# Patient Record
Sex: Female | Born: 1970
Health system: Southern US, Community
[De-identification: ages and names within clinical notes are randomized; demographics above are authoritative.]

## PROBLEM LIST (undated history)

## (undated) DIAGNOSIS — D649 Anemia, unspecified: Secondary | ICD-10-CM

## (undated) DIAGNOSIS — R011 Cardiac murmur, unspecified: Secondary | ICD-10-CM

## (undated) DIAGNOSIS — L309 Dermatitis, unspecified: Secondary | ICD-10-CM

## (undated) DIAGNOSIS — F32A Depression, unspecified: Secondary | ICD-10-CM

## (undated) DIAGNOSIS — T7840XA Allergy, unspecified, initial encounter: Secondary | ICD-10-CM

## (undated) HISTORY — DX: Cardiac murmur, unspecified: R01.1

## (undated) HISTORY — PX: WISDOM TOOTH EXTRACTION: SHX21

## (undated) HISTORY — PX: COLONOSCOPY: SHX174

## (undated) HISTORY — DX: Allergy, unspecified, initial encounter: T78.40XA

## (undated) HISTORY — DX: Anemia, unspecified: D64.9

---

## 1998-11-01 ENCOUNTER — Other Ambulatory Visit: Admission: RE | Admit: 1998-11-01 | Discharge: 1998-11-01 | Payer: Self-pay | Admitting: *Deleted

## 1999-07-11 ENCOUNTER — Other Ambulatory Visit: Admission: RE | Admit: 1999-07-11 | Discharge: 1999-07-11 | Payer: Self-pay | Admitting: *Deleted

## 1999-11-09 ENCOUNTER — Inpatient Hospital Stay (HOSPITAL_COMMUNITY): Admission: AD | Admit: 1999-11-09 | Discharge: 1999-11-09 | Payer: Self-pay | Admitting: *Deleted

## 1999-12-12 ENCOUNTER — Inpatient Hospital Stay (HOSPITAL_COMMUNITY): Admission: AD | Admit: 1999-12-12 | Discharge: 1999-12-12 | Payer: Self-pay | Admitting: *Deleted

## 2000-01-22 ENCOUNTER — Inpatient Hospital Stay (HOSPITAL_COMMUNITY): Admission: AD | Admit: 2000-01-22 | Discharge: 2000-01-24 | Payer: Self-pay | Admitting: Obstetrics and Gynecology

## 2000-01-27 ENCOUNTER — Encounter: Admission: RE | Admit: 2000-01-27 | Discharge: 2000-04-26 | Payer: Self-pay | Admitting: *Deleted

## 2000-02-29 ENCOUNTER — Other Ambulatory Visit: Admission: RE | Admit: 2000-02-29 | Discharge: 2000-02-29 | Payer: Self-pay | Admitting: *Deleted

## 2001-05-28 ENCOUNTER — Other Ambulatory Visit: Admission: RE | Admit: 2001-05-28 | Discharge: 2001-05-28 | Payer: Self-pay | Admitting: *Deleted

## 2002-06-02 ENCOUNTER — Other Ambulatory Visit: Admission: RE | Admit: 2002-06-02 | Discharge: 2002-06-02 | Payer: Self-pay | Admitting: *Deleted

## 2002-11-20 ENCOUNTER — Inpatient Hospital Stay (HOSPITAL_COMMUNITY): Admission: AD | Admit: 2002-11-20 | Discharge: 2002-11-20 | Payer: Self-pay | Admitting: Obstetrics and Gynecology

## 2002-12-11 ENCOUNTER — Inpatient Hospital Stay (HOSPITAL_COMMUNITY): Admission: AD | Admit: 2002-12-11 | Discharge: 2002-12-13 | Payer: Self-pay | Admitting: Obstetrics and Gynecology

## 2003-01-18 ENCOUNTER — Other Ambulatory Visit: Admission: RE | Admit: 2003-01-18 | Discharge: 2003-01-18 | Payer: Self-pay | Admitting: *Deleted

## 2004-01-27 ENCOUNTER — Other Ambulatory Visit: Admission: RE | Admit: 2004-01-27 | Discharge: 2004-01-27 | Payer: Self-pay | Admitting: *Deleted

## 2005-04-24 ENCOUNTER — Other Ambulatory Visit: Admission: RE | Admit: 2005-04-24 | Discharge: 2005-04-24 | Payer: Self-pay | Admitting: Obstetrics and Gynecology

## 2006-01-24 ENCOUNTER — Inpatient Hospital Stay (HOSPITAL_COMMUNITY): Admission: AD | Admit: 2006-01-24 | Discharge: 2006-01-26 | Payer: Self-pay | Admitting: Obstetrics and Gynecology

## 2008-08-25 ENCOUNTER — Emergency Department (HOSPITAL_COMMUNITY): Admission: EM | Admit: 2008-08-25 | Discharge: 2008-08-25 | Payer: Self-pay | Admitting: Emergency Medicine

## 2009-11-20 ENCOUNTER — Ambulatory Visit: Payer: Self-pay | Admitting: Internal Medicine

## 2009-12-28 ENCOUNTER — Ambulatory Visit: Payer: Self-pay | Admitting: Internal Medicine

## 2010-01-02 ENCOUNTER — Encounter: Admission: RE | Admit: 2010-01-02 | Discharge: 2010-01-02 | Payer: Self-pay | Admitting: Internal Medicine

## 2010-05-07 ENCOUNTER — Ambulatory Visit: Payer: Self-pay | Admitting: Internal Medicine

## 2011-06-28 LAB — POCT RAPID STREP A: Streptococcus, Group A Screen (Direct): NEGATIVE

## 2012-01-01 ENCOUNTER — Emergency Department (INDEPENDENT_AMBULATORY_CARE_PROVIDER_SITE_OTHER)
Admission: EM | Admit: 2012-01-01 | Discharge: 2012-01-01 | Disposition: A | Discharge status: Home / Self Care | Payer: BC Managed Care – PPO | Source: Home / Self Care | Attending: Family Medicine | Admitting: Family Medicine

## 2012-01-01 ENCOUNTER — Encounter (HOSPITAL_COMMUNITY): Payer: Self-pay | Admitting: Cardiology

## 2012-01-01 DIAGNOSIS — J329 Chronic sinusitis, unspecified: Secondary | ICD-10-CM

## 2012-01-01 DIAGNOSIS — J40 Bronchitis, not specified as acute or chronic: Secondary | ICD-10-CM

## 2012-01-01 HISTORY — DX: Dermatitis, unspecified: L30.9

## 2012-01-01 LAB — POCT RAPID STREP A: Streptococcus, Group A Screen (Direct): NEGATIVE

## 2012-01-01 MED ORDER — TRIAMCINOLONE ACETONIDE(NASAL) 55 MCG/ACT NA INHA: 2.0000 | Freq: Every day | NASAL | Status: DC

## 2012-01-01 MED ORDER — ACETAMINOPHEN 325 MG PO TABS
ORAL_TABLET | ORAL | Status: AC
  Filled 2012-01-01: qty 2

## 2012-01-01 MED ORDER — FEXOFENADINE HCL 60 MG PO TABS: 60.0000 mg | ORAL_TABLET | Freq: Two times a day (BID) | ORAL | Status: DC

## 2012-01-01 MED ORDER — AZITHROMYCIN 250 MG PO TABS: 250.0000 mg | ORAL_TABLET | Freq: Every day | ORAL | Status: AC

## 2012-01-01 MED ORDER — FLUTICASONE-SALMETEROL 100-50 MCG/DOSE IN AEPB: 1.0000 | INHALATION_SPRAY | Freq: Two times a day (BID) | RESPIRATORY_TRACT | Status: DC

## 2012-01-01 MED ORDER — ALBUTEROL SULFATE HFA 108 (90 BASE) MCG/ACT IN AERS: 2.0000 | INHALATION_SPRAY | Freq: Four times a day (QID) | RESPIRATORY_TRACT | Status: DC | PRN

## 2012-01-01 MED ORDER — HYDROCODONE-ACETAMINOPHEN 7.5-500 MG/15ML PO SOLN: 15.0000 mL | Freq: Three times a day (TID) | ORAL | Status: AC | PRN

## 2012-01-01 MED ORDER — IBUPROFEN 800 MG PO TABS
ORAL_TABLET | ORAL | Status: AC
  Filled 2012-01-01: qty 1

## 2012-01-01 MED ORDER — IBUPROFEN 600 MG PO TABS: 600.0000 mg | ORAL_TABLET | Freq: Three times a day (TID) | ORAL | Status: AC

## 2012-01-01 MED ORDER — ONDANSETRON 4 MG PO TBDP
4.0000 mg | ORAL_TABLET | Freq: Once | ORAL | Status: AC
  Administered 2012-01-01: 4 mg via ORAL

## 2012-01-01 MED ORDER — ACETAMINOPHEN 325 MG PO TABS
650.0000 mg | ORAL_TABLET | Freq: Once | ORAL | Status: AC
  Administered 2012-01-01: 650 mg via ORAL

## 2012-01-01 MED ORDER — IBUPROFEN 800 MG PO TABS: 800.0000 mg | ORAL_TABLET | Freq: Once | ORAL | Status: DC

## 2012-01-01 MED ORDER — ONDANSETRON 4 MG PO TBDP
ORAL_TABLET | ORAL | Status: AC
  Filled 2012-01-01: qty 1

## 2012-01-01 MED ORDER — PREDNISONE 20 MG PO TABS: ORAL_TABLET | ORAL | Status: AC

## 2012-01-01 NOTE — ED Provider Notes (Signed)
History     CSN: [id number]  Arrival date & time 01/01/12  1433   First MD Initiated Contact with Patient 01/01/12 1609      Chief Complaint  Patient presents with  . Shortness of Breath  . Cough  . Sore Throat    (Consider location/radiation/quality/duration/timing/severity/associated sxs/prior treatment) HPI Comments: 41 y/o female h/o asthma, allergic/chronic rhinitis and recurrent sinusitis. Here c/o 2 days with fever up to 103, generalized body aches, cough with green sputum, sore throat, nasal congestion, sinus pressure, headache, nausea. No vomiting or diarrhea. Using her albuterol and Advair inhalers daily. Denies current wheezing or shortness of breath. Needs refills on her inhalers. No chest pain. Had allergy symptoms about 3 days prior worsening cough,malaise and fever.   Past Medical History  Diagnosis Date  . Asthma   . Dermatitis     Past Surgical History  Procedure Date  . Vaginal delivery 2001, 2004, 2007    Family History  Problem Relation Age of Onset  . Hyperlipidemia Mother   . Thyroid disease Mother   . Hyperlipidemia Father   . Bipolar disorder Father   . Neuropathy Father     History  Substance Use Topics  . Smoking status: Never Smoker   . Smokeless tobacco: Not on file  . Alcohol Use: Yes     occas    OB History    Grav Para Term Preterm Abortions TAB SAB Ect Mult Living                  Review of Systems  Constitutional: Positive for fever and chills. Negative for appetite change.  HENT: Positive for congestion, sore throat, rhinorrhea, postnasal drip and sinus pressure. Negative for ear pain, trouble swallowing, neck pain and neck stiffness.   Respiratory: Positive for cough and chest tightness. Negative for shortness of breath and wheezing.   Cardiovascular: Negative for chest pain and leg swelling.  Gastrointestinal: Positive for nausea. Negative for vomiting, abdominal pain and diarrhea.  Musculoskeletal: Positive for myalgias  and arthralgias.  Skin: Negative for rash.  Neurological: Positive for headaches. Negative for dizziness.    Allergies  Seasonal  Home Medications   Current Outpatient Rx  Name Route Sig Dispense Refill  . ALBUTEROL SULFATE HFA 108 (90 BASE) MCG/ACT IN AERS Inhalation Inhale 2 puffs into the lungs every 6 (six) hours as needed. 1 Inhaler 0  . AZITHROMYCIN 250 MG PO TABS Oral Take 1 tablet (250 mg total) by mouth daily. Take first 2 tablets together, then 1 every day until finished. 6 tablet 0  . FEXOFENADINE HCL 60 MG PO TABS Oral Take 1 tablet (60 mg total) by mouth 2 (two) times daily. 60 tablet 0  . FLUTICASONE-SALMETEROL 100-50 MCG/DOSE IN AEPB Inhalation Inhale 1 puff into the lungs 2 (two) times daily. Uses at on set of congestion 60 each 0  . HYDROCODONE-ACETAMINOPHEN 7.5-500 MG/15ML PO SOLN Oral Take 15 mLs by mouth every 8 (eight) hours as needed for pain or cough. 120 mL 0  . IBUPROFEN 600 MG PO TABS Oral Take 1 tablet (600 mg total) by mouth 3 (three) times daily. 20 tablet 0  . PREDNISONE 20 MG PO TABS  2 tabs po daily for 5 days 10 tablet no  . TRIAMCINOLONE ACETONIDE 55 MCG/ACT NA INHA Nasal Place 2 sprays into the nose daily. 1 Inhaler 0    BP 140/85  Pulse 125  Temp(Src) 101.4 F (38.6 C) (Oral)  Resp 21  SpO2 100%  LMP  11/03/2011  Physical Exam  Nursing note and vitals reviewed. Constitutional: She is oriented to person, place, and time. She appears well-developed and well-nourished.       Febrile, laying in bed.  HENT:  Head: Normocephalic and atraumatic.  Right Ear: External ear normal.  Left Ear: External ear normal.       Nasal Congestion with erythema and swelling of nasal turbinates, white rhinorrhea. Pharyngeal erythema no swelling or exudates. No uvula deviation. No trismus. Reported frontomaxillary sinus tenderness with palpation. TM's with increased vascular markings and some dullness bilaterally no swelling or bulging   Eyes: Conjunctivae are  normal. Pupils are equal, round, and reactive to light. Right eye exhibits no discharge. Left eye exhibits no discharge. No scleral icterus.  Neck: Neck supple. No thyromegaly present.  Cardiovascular: Regular rhythm, normal heart sounds and intact distal pulses.  Exam reveals no gallop and no friction rub.   No murmur heard.      Tachycardic due to fever.  Pulmonary/Chest: Effort normal. No respiratory distress. She has no wheezes. She has no rales. She exhibits no tenderness.       Bronchitic productive cough. green thick sputum observed.   Abdominal: Soft. She exhibits no distension. There is no tenderness.  Lymphadenopathy:    She has no cervical adenopathy.  Neurological: She is alert and oriented to person, place, and time.  Skin: No rash noted.    ED Course  Procedures (including critical care time)   Labs Reviewed  POCT RAPID STREP A ([facility]RG CARE ONLY)  LAB REPORT - SCANNED   No results found.   1. Bronchitis   2. Sinusitis       MDM  Negative rapid strept test. Treated with prednisone, azithromycin, ibuprofen, allegra and hydrocodone. Refilled albuterol and advair. Asked to returned if chest pain, difficulty breathing or worsening symptoms despite following treatment.        Sharin Grave, MD 01/03/12 [phone]

## 2012-01-01 NOTE — ED Notes (Signed)
Pt reports onset of symptoms cough, sore throat, chills and allergy symptoms that started 2 days ago. Pt reports fever of 103 at home. History of asthma and has been taking albuterol at hs and advair daily for the past 2 days. Pt almost out of advair.

## 2012-01-01 NOTE — Discharge Instructions (Signed)
  Is very important top keep well hydrated. Take the prescribed medications as instructed. Take ibuprofen scheduled every 8 hours for the next 24-48 hours take with food and plenty of liquids as it can upset your stomach. Use nasal saline spray at least 3 times a day. (simply saline is over the counter) can alternate with your Nasacort. Return if difficulty breathing or not keeping fluids down.

## 2012-10-06 ENCOUNTER — Other Ambulatory Visit: Payer: BC Managed Care – PPO | Admitting: Internal Medicine

## 2012-10-06 ENCOUNTER — Other Ambulatory Visit: Payer: Self-pay | Admitting: Internal Medicine

## 2012-10-06 DIAGNOSIS — Z0000 Encounter for general adult medical examination without abnormal findings: Secondary | ICD-10-CM

## 2012-10-06 DIAGNOSIS — J45909 Unspecified asthma, uncomplicated: Secondary | ICD-10-CM

## 2012-10-06 LAB — LIPID PANEL
Cholesterol: 168 mg/dL (ref 0–200)
HDL: 66 mg/dL (ref >39)
LDL Cholesterol: 82 mg/dL (ref 0–99)
Total CHOL/HDL Ratio: 2.5 Ratio
Triglycerides: 99 mg/dL (ref <150)
VLDL: 20 mg/dL (ref 0–40)

## 2012-10-06 LAB — COMPREHENSIVE METABOLIC PANEL
ALT: 12 U/L (ref 0–35)
AST: 16 U/L (ref 0–37)
Albumin: 3.7 g/dL (ref 3.5–5.2)
Alkaline Phosphatase: 55 U/L (ref 39–117)
BUN: 14 mg/dL (ref 6–23)
CO2: 26 mEq/L (ref 19–32)
Calcium: 9.2 mg/dL (ref 8.4–10.5)
Chloride: 105 mEq/L (ref 96–112)
Creat: 0.77 mg/dL (ref 0.50–1.10)
Glucose, Bld: 78 mg/dL (ref 70–99)
Potassium: 4 mEq/L (ref 3.5–5.3)
Sodium: 138 mEq/L (ref 135–145)
Total Bilirubin: 1 mg/dL (ref 0.3–1.2)
Total Protein: 6.6 g/dL (ref 6.0–8.3)

## 2012-10-06 LAB — CBC WITH DIFFERENTIAL/PLATELET
Basophils Absolute: 0 10*3/uL (ref 0.0–0.1)
Basophils Relative: 0 % (ref 0–1)
Eosinophils Absolute: 0.5 10*3/uL (ref 0.0–0.7)
Eosinophils Relative: 4 % (ref 0–5)
HCT: 39.3 % (ref 36.0–46.0)
Hemoglobin: 13.5 g/dL (ref 12.0–15.0)
Lymphocytes Relative: 20 % (ref 12–46)
Lymphs Abs: 2.6 10*3/uL (ref 0.7–4.0)
MCH: 32.9 pg (ref 26.0–34.0)
MCHC: 34.4 g/dL (ref 30.0–36.0)
MCV: 95.9 fL (ref 78.0–100.0)
Monocytes Absolute: 0.6 10*3/uL (ref 0.1–1.0)
Monocytes Relative: 5 % (ref 3–12)
Neutro Abs: 9.1 10*3/uL — ABNORMAL HIGH (ref 1.7–7.7)
Neutrophils Relative %: 71 % (ref 43–77)
Platelets: 346 10*3/uL (ref 150–400)
RBC: 4.1 MIL/uL (ref 3.87–5.11)
RDW: 13.4 % (ref 11.5–15.5)
WBC: 12.8 10*3/uL — ABNORMAL HIGH (ref 4.0–10.5)

## 2012-10-06 LAB — TSH: TSH: 3.779 u[IU]/mL (ref 0.350–4.500)

## 2012-10-07 LAB — VITAMIN D 25 HYDROXY (VIT D DEFICIENCY, FRACTURES): Vit D, 25-Hydroxy: 67 ng/mL (ref 30–89)

## 2012-10-08 ENCOUNTER — Other Ambulatory Visit: Payer: BC Managed Care – PPO | Admitting: Internal Medicine

## 2012-10-08 ENCOUNTER — Ambulatory Visit (INDEPENDENT_AMBULATORY_CARE_PROVIDER_SITE_OTHER): Payer: BC Managed Care – PPO | Admitting: Internal Medicine

## 2012-10-08 ENCOUNTER — Encounter: Payer: Self-pay | Admitting: Internal Medicine

## 2012-10-08 VITALS — BP 104/72 | HR 76 | Temp 98.5°F | Ht 62.5 in | Wt 118.0 lb

## 2012-10-08 DIAGNOSIS — M8580 Other specified disorders of bone density and structure, unspecified site: Secondary | ICD-10-CM | POA: Insufficient documentation

## 2012-10-08 DIAGNOSIS — I8390 Asymptomatic varicose veins of unspecified lower extremity: Secondary | ICD-10-CM

## 2012-10-08 DIAGNOSIS — J309 Allergic rhinitis, unspecified: Secondary | ICD-10-CM

## 2012-10-08 DIAGNOSIS — M899 Disorder of bone, unspecified: Secondary | ICD-10-CM

## 2012-10-08 DIAGNOSIS — J069 Acute upper respiratory infection, unspecified: Secondary | ICD-10-CM

## 2012-10-08 DIAGNOSIS — I83893 Varicose veins of bilateral lower extremities with other complications: Secondary | ICD-10-CM | POA: Insufficient documentation

## 2012-10-08 DIAGNOSIS — M949 Disorder of cartilage, unspecified: Secondary | ICD-10-CM

## 2012-10-08 DIAGNOSIS — Z0000 Encounter for general adult medical examination without abnormal findings: Secondary | ICD-10-CM

## 2012-10-08 DIAGNOSIS — J45909 Unspecified asthma, uncomplicated: Secondary | ICD-10-CM

## 2012-10-08 NOTE — Patient Instructions (Addendum)
Call endocrinologist you saw in 2011 and ask for follow up recommendations for osteopenia. Hearing screen is normal. Antigliadin antibodies added at your request for Family history of sprue.

## 2012-10-09 ENCOUNTER — Encounter: Payer: BC Managed Care – PPO | Admitting: Internal Medicine

## 2012-10-09 LAB — CELIAC PANEL 10
Endomysial Screen: NEGATIVE
Gliadin IgA: 4.1 U/mL (ref <20)
Gliadin IgG: 14.1 U/mL (ref <20)
IgA: 221 mg/dL (ref 69–380)
Tissue Transglut Ab: 7.1 U/mL (ref <20)
Tissue Transglutaminase Ab, IgA: 2.8 U/mL (ref <20)

## 2012-11-28 NOTE — Progress Notes (Signed)
Subjective:    Patient ID: Tammy Hendrix, female    DOB: 11-10-1970, 42 y.o.   MRN: [id number]  HPI 42 year old white female presents for health maintenance and evaluation of medical problems. Patient wants her hearing checked. Complains of multiple varicosities both legs. She's had a recent URI. Had sinus infection in December treated with a steroid injection. Dr. Vincente Poli is GYN physician. Patient has history of familial osteoporosis. History of allergic rhinitis treated by [facility]. Uses Claritin daily, when necessary albuterol, when necessary Advair and Nasonex.  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 Husband is an attorney with [employer]. She has 3 children 2 daughters and a son. Social alcohol consumption. Does not smoke.  Family history: Father is a Development worker, community. He has a history of hyperlipidemia, neuropathy, genetic osteoporosis, esophagitis and gastritis. Mother has history of gallstones and allergic rhinitis and asthma. She also has hypertension. 2 brothers in good health. 2 sisters in good health.  No known drug allergies. No history of operations. No hospitalizations other than childbirth.  Also has  Eczema and asthma. Takes vitamin D and calcium.  Patient had bone density study in April 2011 with T score LS-spine -2.2 compared to 2005 when it was -2.5. Left femoral T score -2.1 and was previously -2.6. This study shows osteopenia rather than osteoporosis and looks better than the study in 2005.  Allergy testing in 2010 positive for dust mite, trees, grasses, shellfish , peanut, dog, cat and feathers    Review of Systems  Constitutional: Negative.   HENT: Positive for hearing loss, congestion, rhinorrhea, sneezing and postnasal drip.   Eyes: Negative.   Respiratory:       History of asthma  Endocrine:       History of familial osteoporosis/osteopenia  Genitourinary: Negative.   Allergic/Immunologic: Positive for environmental allergies and food  allergies.  Neurological: Negative.   Hematological: Negative.   Psychiatric/Behavioral: Negative.        Objective:   Physical Exam  Vitals reviewed. Constitutional: She is oriented to person, place, and time. She appears well-developed and well-nourished. No distress.  HENT:  Head: Normocephalic and atraumatic.  Right Ear: External ear normal.  Left Ear: External ear normal.  Nose: Nose normal.  Mouth/Throat: Oropharynx is clear and moist. No oropharyngeal exudate.  Eyes: Conjunctivae and EOM are normal. Pupils are equal, round, and reactive to light. Right eye exhibits no discharge. Left eye exhibits no discharge. No scleral icterus.  . Is normal in each year at 20 dB  Neck: Normal range of motion. Neck supple. No JVD present. No tracheal deviation present. No thyromegaly present.  Cardiovascular: Normal rate, regular rhythm, normal heart sounds and intact distal pulses.   No murmur heard. Pulmonary/Chest: Effort normal and breath sounds normal. No respiratory distress. She has no wheezes. She has no rales. She exhibits no tenderness.  Breasts normal female  Abdominal: Soft. Bowel sounds are normal. She exhibits no distension and no mass. There is no tenderness. There is no rebound and no guarding.  Musculoskeletal: She exhibits no edema.  Lymphadenopathy:    She has no cervical adenopathy.  Neurological: She is alert and oriented to person, place, and time. She has normal reflexes. She displays normal reflexes. No cranial nerve deficit. Coordination normal.  Skin: Skin is warm and dry. No rash noted. She is not diaphoretic.  Multiple varicosities both legs  Psychiatric: She has a normal mood and affect. Her behavior is normal. Judgment and thought content normal.  Assessment & Plan:  Multiple varicosities both legs  Familial osteopenia/osteoporosis-advise patient to contact endocrinologist that she saw in 2011 for device. Seems that bone density improved from  2005-2011.  Allergic rhinitis  History of asthma  History of eczema  Plan: Hearing is within normal limits although she is complaining of some hearing loss. Will schedule consultation with vascular surgeon regarding varicosities of legs. Patient asking for test for celiac disease. This will be added to her lab work.

## 2012-12-01 ENCOUNTER — Telehealth: Payer: Self-pay

## 2012-12-01 DIAGNOSIS — I83893 Varicose veins of bilateral lower extremities with other complications: Secondary | ICD-10-CM

## 2012-12-02 NOTE — Telephone Encounter (Signed)
Referral done to [facility]. They will call patient directly

## 2013-01-07 ENCOUNTER — Telehealth: Payer: Self-pay | Admitting: Internal Medicine

## 2014-08-17 ENCOUNTER — Encounter: Payer: Self-pay | Admitting: Internal Medicine

## 2014-12-27 ENCOUNTER — Telehealth: Payer: Self-pay | Admitting: Internal Medicine

## 2014-12-27 ENCOUNTER — Ambulatory Visit (INDEPENDENT_AMBULATORY_CARE_PROVIDER_SITE_OTHER): Payer: BLUE CROSS/BLUE SHIELD | Admitting: Internal Medicine

## 2014-12-27 ENCOUNTER — Encounter: Payer: Self-pay | Admitting: Internal Medicine

## 2014-12-27 VITALS — BP 106/74 | HR 70 | Temp 98.0°F | Wt 120.0 lb

## 2014-12-27 DIAGNOSIS — A09 Infectious gastroenteritis and colitis, unspecified: Secondary | ICD-10-CM | POA: Diagnosis not present

## 2014-12-27 MED ORDER — CIPROFLOXACIN HCL 500 MG PO TABS: 500.0000 mg | ORAL_TABLET | Freq: Two times a day (BID) | ORAL | Status: DC

## 2014-12-27 MED ORDER — METRONIDAZOLE 500 MG PO TABS: 500.0000 mg | ORAL_TABLET | Freq: Two times a day (BID) | ORAL | Status: DC

## 2014-12-27 NOTE — Progress Notes (Signed)
   Subjective:    Patient ID: Tammy Hendrix, female    DOB: 06-Sep-1971, 44 y.o.   MRN: [id number]  HPI  She went on a trip to [place] and [place] She returned home on Sunday, April 3. Was beginning to have a common on cramping prior to departure. After arriving home she developed diarrhea. No blood in stool. No fever or shaking chills. While there, she ate a hamburger that was not cooked all that well and a runny egg. No significant nausea just abdominal cramping and diarrhea. No vomiting    Review of Systems     Objective:   Physical Exam  Not examined      Assessment & Plan:  Diarrhea with international travel  Plan: Cipro 500 mg twice daily for 7 days. Flagyl 500 mg twice daily for 7 days. Clear liquids and advance diet slowly.

## 2014-12-27 NOTE — Telephone Encounter (Signed)
Spoke with patient and advised that Dr. Renold Genta would really prefer that she come in to see her.  Azythromycin most likely will NOT take care of this.   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  IF she is unable to get someone to take it today, she will reschedule for appointment for tomorrow, 4/6 at 11:00.  Otherwise, she'll be seen today, 4/5 @ 4:15 p.m.  Confirmed.

## 2014-12-27 NOTE — Patient Instructions (Signed)
Take Cipro and Flagyl as prescribed. Clear liquids and advance diet slowly. Call if not better in 72 hours or sooner if worse

## 2014-12-27 NOTE — Telephone Encounter (Signed)
Was traveling in the [place] returned on Sunday.  Started having cramping and diarrhea all day yesterday and some today.  Taking Azythromycin (day #2)   Offered her appointment this evening at 4:15.  Advised we would not be seeing patients tomorrow and next appointment would be on Thursday.  Advised patient to do clear liquids until diarrhea resolved.  Said she had toast this a.m. And she said "I'm hungry".  I said, you need to allow the gut to rest by only having clear liquids until the diarrhea resolves.  No vomiting.  Cramps seems to be the main issue for her at this time causing her pain.  Cramping described as menstrual cramps.  Rated as a 5-6 on a scale of 1-10.

## 2014-12-27 NOTE — Telephone Encounter (Signed)
Please have her come in likely has Traveler's diarrhea

## 2015-12-26 DIAGNOSIS — J3089 Other allergic rhinitis: Secondary | ICD-10-CM | POA: Diagnosis not present

## 2015-12-26 DIAGNOSIS — J301 Allergic rhinitis due to pollen: Secondary | ICD-10-CM | POA: Diagnosis not present

## 2015-12-29 DIAGNOSIS — J301 Allergic rhinitis due to pollen: Secondary | ICD-10-CM | POA: Diagnosis not present

## 2015-12-29 DIAGNOSIS — J3089 Other allergic rhinitis: Secondary | ICD-10-CM | POA: Diagnosis not present

## 2016-01-18 DIAGNOSIS — J4520 Mild intermittent asthma, uncomplicated: Secondary | ICD-10-CM | POA: Diagnosis not present

## 2016-01-18 DIAGNOSIS — J3089 Other allergic rhinitis: Secondary | ICD-10-CM | POA: Diagnosis not present

## 2016-01-18 DIAGNOSIS — J301 Allergic rhinitis due to pollen: Secondary | ICD-10-CM | POA: Diagnosis not present

## 2016-01-26 DIAGNOSIS — J3089 Other allergic rhinitis: Secondary | ICD-10-CM | POA: Diagnosis not present

## 2016-01-26 DIAGNOSIS — J301 Allergic rhinitis due to pollen: Secondary | ICD-10-CM | POA: Diagnosis not present

## 2016-02-02 DIAGNOSIS — J3089 Other allergic rhinitis: Secondary | ICD-10-CM | POA: Diagnosis not present

## 2016-02-02 DIAGNOSIS — J301 Allergic rhinitis due to pollen: Secondary | ICD-10-CM | POA: Diagnosis not present

## 2016-02-09 DIAGNOSIS — J301 Allergic rhinitis due to pollen: Secondary | ICD-10-CM | POA: Diagnosis not present

## 2016-02-09 DIAGNOSIS — J3089 Other allergic rhinitis: Secondary | ICD-10-CM | POA: Diagnosis not present

## 2016-02-16 DIAGNOSIS — J3089 Other allergic rhinitis: Secondary | ICD-10-CM | POA: Diagnosis not present

## 2016-02-16 DIAGNOSIS — J301 Allergic rhinitis due to pollen: Secondary | ICD-10-CM | POA: Diagnosis not present

## 2016-02-23 DIAGNOSIS — J3089 Other allergic rhinitis: Secondary | ICD-10-CM | POA: Diagnosis not present

## 2016-02-23 DIAGNOSIS — J301 Allergic rhinitis due to pollen: Secondary | ICD-10-CM | POA: Diagnosis not present

## 2016-02-27 ENCOUNTER — Ambulatory Visit: Payer: BLUE CROSS/BLUE SHIELD | Admitting: Pediatrics

## 2016-03-06 DIAGNOSIS — J301 Allergic rhinitis due to pollen: Secondary | ICD-10-CM | POA: Diagnosis not present

## 2016-03-06 DIAGNOSIS — J3089 Other allergic rhinitis: Secondary | ICD-10-CM | POA: Diagnosis not present

## 2016-03-21 DIAGNOSIS — Z6822 Body mass index (BMI) 22.0-22.9, adult: Secondary | ICD-10-CM | POA: Diagnosis not present

## 2016-03-21 DIAGNOSIS — Z1231 Encounter for screening mammogram for malignant neoplasm of breast: Secondary | ICD-10-CM | POA: Diagnosis not present

## 2016-03-21 DIAGNOSIS — Z1212 Encounter for screening for malignant neoplasm of rectum: Secondary | ICD-10-CM | POA: Diagnosis not present

## 2016-03-21 DIAGNOSIS — Z01419 Encounter for gynecological examination (general) (routine) without abnormal findings: Secondary | ICD-10-CM | POA: Diagnosis not present

## 2016-03-22 DIAGNOSIS — J301 Allergic rhinitis due to pollen: Secondary | ICD-10-CM | POA: Diagnosis not present

## 2016-03-22 DIAGNOSIS — J3089 Other allergic rhinitis: Secondary | ICD-10-CM | POA: Diagnosis not present

## 2016-03-25 DIAGNOSIS — L820 Inflamed seborrheic keratosis: Secondary | ICD-10-CM | POA: Diagnosis not present

## 2016-03-25 DIAGNOSIS — L821 Other seborrheic keratosis: Secondary | ICD-10-CM | POA: Diagnosis not present

## 2016-03-25 DIAGNOSIS — L720 Epidermal cyst: Secondary | ICD-10-CM | POA: Diagnosis not present

## 2016-03-25 DIAGNOSIS — B078 Other viral warts: Secondary | ICD-10-CM | POA: Diagnosis not present

## 2016-03-29 DIAGNOSIS — J301 Allergic rhinitis due to pollen: Secondary | ICD-10-CM | POA: Diagnosis not present

## 2016-03-29 DIAGNOSIS — J3089 Other allergic rhinitis: Secondary | ICD-10-CM | POA: Diagnosis not present

## 2016-04-04 DIAGNOSIS — J3089 Other allergic rhinitis: Secondary | ICD-10-CM | POA: Diagnosis not present

## 2016-04-04 DIAGNOSIS — J301 Allergic rhinitis due to pollen: Secondary | ICD-10-CM | POA: Diagnosis not present

## 2016-04-19 DIAGNOSIS — J301 Allergic rhinitis due to pollen: Secondary | ICD-10-CM | POA: Diagnosis not present

## 2016-04-19 DIAGNOSIS — J3089 Other allergic rhinitis: Secondary | ICD-10-CM | POA: Diagnosis not present

## 2016-05-02 DIAGNOSIS — J3089 Other allergic rhinitis: Secondary | ICD-10-CM | POA: Diagnosis not present

## 2016-05-02 DIAGNOSIS — J301 Allergic rhinitis due to pollen: Secondary | ICD-10-CM | POA: Diagnosis not present

## 2016-05-20 DIAGNOSIS — J3089 Other allergic rhinitis: Secondary | ICD-10-CM | POA: Diagnosis not present

## 2016-05-20 DIAGNOSIS — J301 Allergic rhinitis due to pollen: Secondary | ICD-10-CM | POA: Diagnosis not present

## 2016-05-24 DIAGNOSIS — J3089 Other allergic rhinitis: Secondary | ICD-10-CM | POA: Diagnosis not present

## 2016-05-24 DIAGNOSIS — J301 Allergic rhinitis due to pollen: Secondary | ICD-10-CM | POA: Diagnosis not present

## 2016-05-31 DIAGNOSIS — J3089 Other allergic rhinitis: Secondary | ICD-10-CM | POA: Diagnosis not present

## 2016-05-31 DIAGNOSIS — J301 Allergic rhinitis due to pollen: Secondary | ICD-10-CM | POA: Diagnosis not present

## 2016-06-05 DIAGNOSIS — L821 Other seborrheic keratosis: Secondary | ICD-10-CM | POA: Diagnosis not present

## 2016-06-05 DIAGNOSIS — B078 Other viral warts: Secondary | ICD-10-CM | POA: Diagnosis not present

## 2016-06-14 DIAGNOSIS — J301 Allergic rhinitis due to pollen: Secondary | ICD-10-CM | POA: Diagnosis not present

## 2016-06-14 DIAGNOSIS — J3089 Other allergic rhinitis: Secondary | ICD-10-CM | POA: Diagnosis not present

## 2016-06-28 DIAGNOSIS — J301 Allergic rhinitis due to pollen: Secondary | ICD-10-CM | POA: Diagnosis not present

## 2016-06-28 DIAGNOSIS — J3089 Other allergic rhinitis: Secondary | ICD-10-CM | POA: Diagnosis not present

## 2016-07-02 DIAGNOSIS — J3089 Other allergic rhinitis: Secondary | ICD-10-CM | POA: Diagnosis not present

## 2016-07-12 DIAGNOSIS — J301 Allergic rhinitis due to pollen: Secondary | ICD-10-CM | POA: Diagnosis not present

## 2016-07-12 DIAGNOSIS — J3089 Other allergic rhinitis: Secondary | ICD-10-CM | POA: Diagnosis not present

## 2016-08-02 DIAGNOSIS — J3089 Other allergic rhinitis: Secondary | ICD-10-CM | POA: Diagnosis not present

## 2016-08-02 DIAGNOSIS — J301 Allergic rhinitis due to pollen: Secondary | ICD-10-CM | POA: Diagnosis not present

## 2016-08-09 DIAGNOSIS — J301 Allergic rhinitis due to pollen: Secondary | ICD-10-CM | POA: Diagnosis not present

## 2016-08-09 DIAGNOSIS — J3089 Other allergic rhinitis: Secondary | ICD-10-CM | POA: Diagnosis not present

## 2016-08-13 DIAGNOSIS — J3089 Other allergic rhinitis: Secondary | ICD-10-CM | POA: Diagnosis not present

## 2016-08-13 DIAGNOSIS — Z23 Encounter for immunization: Secondary | ICD-10-CM | POA: Diagnosis not present

## 2016-08-13 DIAGNOSIS — J301 Allergic rhinitis due to pollen: Secondary | ICD-10-CM | POA: Diagnosis not present

## 2016-09-05 DIAGNOSIS — J3089 Other allergic rhinitis: Secondary | ICD-10-CM | POA: Diagnosis not present

## 2016-09-05 DIAGNOSIS — J301 Allergic rhinitis due to pollen: Secondary | ICD-10-CM | POA: Diagnosis not present

## 2016-09-18 DIAGNOSIS — J3089 Other allergic rhinitis: Secondary | ICD-10-CM | POA: Diagnosis not present

## 2016-09-18 DIAGNOSIS — J301 Allergic rhinitis due to pollen: Secondary | ICD-10-CM | POA: Diagnosis not present

## 2016-10-18 DIAGNOSIS — J301 Allergic rhinitis due to pollen: Secondary | ICD-10-CM | POA: Diagnosis not present

## 2016-10-18 DIAGNOSIS — J3089 Other allergic rhinitis: Secondary | ICD-10-CM | POA: Diagnosis not present

## 2016-10-25 DIAGNOSIS — J3089 Other allergic rhinitis: Secondary | ICD-10-CM | POA: Diagnosis not present

## 2016-10-25 DIAGNOSIS — J301 Allergic rhinitis due to pollen: Secondary | ICD-10-CM | POA: Diagnosis not present

## 2016-11-22 DIAGNOSIS — J3089 Other allergic rhinitis: Secondary | ICD-10-CM | POA: Diagnosis not present

## 2016-11-22 DIAGNOSIS — J301 Allergic rhinitis due to pollen: Secondary | ICD-10-CM | POA: Diagnosis not present

## 2016-11-29 DIAGNOSIS — J301 Allergic rhinitis due to pollen: Secondary | ICD-10-CM | POA: Diagnosis not present

## 2016-11-29 DIAGNOSIS — J3089 Other allergic rhinitis: Secondary | ICD-10-CM | POA: Diagnosis not present

## 2016-12-17 DIAGNOSIS — J3089 Other allergic rhinitis: Secondary | ICD-10-CM | POA: Diagnosis not present

## 2016-12-17 DIAGNOSIS — J301 Allergic rhinitis due to pollen: Secondary | ICD-10-CM | POA: Diagnosis not present

## 2017-01-08 DIAGNOSIS — J3089 Other allergic rhinitis: Secondary | ICD-10-CM | POA: Diagnosis not present

## 2017-01-08 DIAGNOSIS — J301 Allergic rhinitis due to pollen: Secondary | ICD-10-CM | POA: Diagnosis not present

## 2017-01-15 DIAGNOSIS — J3089 Other allergic rhinitis: Secondary | ICD-10-CM | POA: Diagnosis not present

## 2017-01-15 DIAGNOSIS — J301 Allergic rhinitis due to pollen: Secondary | ICD-10-CM | POA: Diagnosis not present

## 2017-01-27 DIAGNOSIS — J3089 Other allergic rhinitis: Secondary | ICD-10-CM | POA: Diagnosis not present

## 2017-01-27 DIAGNOSIS — J301 Allergic rhinitis due to pollen: Secondary | ICD-10-CM | POA: Diagnosis not present

## 2017-02-12 DIAGNOSIS — J3089 Other allergic rhinitis: Secondary | ICD-10-CM | POA: Diagnosis not present

## 2017-02-12 DIAGNOSIS — J301 Allergic rhinitis due to pollen: Secondary | ICD-10-CM | POA: Diagnosis not present

## 2017-02-19 DIAGNOSIS — J3089 Other allergic rhinitis: Secondary | ICD-10-CM | POA: Diagnosis not present

## 2017-02-19 DIAGNOSIS — J301 Allergic rhinitis due to pollen: Secondary | ICD-10-CM | POA: Diagnosis not present

## 2017-02-25 DIAGNOSIS — J301 Allergic rhinitis due to pollen: Secondary | ICD-10-CM | POA: Diagnosis not present

## 2017-02-25 DIAGNOSIS — J3089 Other allergic rhinitis: Secondary | ICD-10-CM | POA: Diagnosis not present

## 2017-03-03 DIAGNOSIS — J301 Allergic rhinitis due to pollen: Secondary | ICD-10-CM | POA: Diagnosis not present

## 2017-03-03 DIAGNOSIS — J3089 Other allergic rhinitis: Secondary | ICD-10-CM | POA: Diagnosis not present

## 2017-03-13 DIAGNOSIS — J301 Allergic rhinitis due to pollen: Secondary | ICD-10-CM | POA: Diagnosis not present

## 2017-03-13 DIAGNOSIS — J3089 Other allergic rhinitis: Secondary | ICD-10-CM | POA: Diagnosis not present

## 2017-03-19 DIAGNOSIS — J3089 Other allergic rhinitis: Secondary | ICD-10-CM | POA: Diagnosis not present

## 2017-03-19 DIAGNOSIS — J4520 Mild intermittent asthma, uncomplicated: Secondary | ICD-10-CM | POA: Diagnosis not present

## 2017-03-19 DIAGNOSIS — L209 Atopic dermatitis, unspecified: Secondary | ICD-10-CM | POA: Diagnosis not present

## 2017-03-19 DIAGNOSIS — J301 Allergic rhinitis due to pollen: Secondary | ICD-10-CM | POA: Diagnosis not present

## 2017-03-20 DIAGNOSIS — J301 Allergic rhinitis due to pollen: Secondary | ICD-10-CM | POA: Diagnosis not present

## 2017-03-25 DIAGNOSIS — Z1231 Encounter for screening mammogram for malignant neoplasm of breast: Secondary | ICD-10-CM | POA: Diagnosis not present

## 2017-03-25 DIAGNOSIS — Z01419 Encounter for gynecological examination (general) (routine) without abnormal findings: Secondary | ICD-10-CM | POA: Diagnosis not present

## 2017-03-25 DIAGNOSIS — Z6821 Body mass index (BMI) 21.0-21.9, adult: Secondary | ICD-10-CM | POA: Diagnosis not present

## 2017-03-25 DIAGNOSIS — Z1212 Encounter for screening for malignant neoplasm of rectum: Secondary | ICD-10-CM | POA: Diagnosis not present

## 2017-04-01 DIAGNOSIS — J301 Allergic rhinitis due to pollen: Secondary | ICD-10-CM | POA: Diagnosis not present

## 2017-04-01 DIAGNOSIS — J3089 Other allergic rhinitis: Secondary | ICD-10-CM | POA: Diagnosis not present

## 2017-04-10 DIAGNOSIS — J3089 Other allergic rhinitis: Secondary | ICD-10-CM | POA: Diagnosis not present

## 2017-04-18 DIAGNOSIS — J3089 Other allergic rhinitis: Secondary | ICD-10-CM | POA: Diagnosis not present

## 2017-04-18 DIAGNOSIS — J301 Allergic rhinitis due to pollen: Secondary | ICD-10-CM | POA: Diagnosis not present

## 2017-04-22 DIAGNOSIS — J301 Allergic rhinitis due to pollen: Secondary | ICD-10-CM | POA: Diagnosis not present

## 2017-04-22 DIAGNOSIS — J3089 Other allergic rhinitis: Secondary | ICD-10-CM | POA: Diagnosis not present

## 2017-04-30 DIAGNOSIS — J3089 Other allergic rhinitis: Secondary | ICD-10-CM | POA: Diagnosis not present

## 2017-04-30 DIAGNOSIS — J301 Allergic rhinitis due to pollen: Secondary | ICD-10-CM | POA: Diagnosis not present

## 2017-05-12 DIAGNOSIS — J301 Allergic rhinitis due to pollen: Secondary | ICD-10-CM | POA: Diagnosis not present

## 2017-05-12 DIAGNOSIS — J3089 Other allergic rhinitis: Secondary | ICD-10-CM | POA: Diagnosis not present

## 2017-05-19 DIAGNOSIS — J3089 Other allergic rhinitis: Secondary | ICD-10-CM | POA: Diagnosis not present

## 2017-05-19 DIAGNOSIS — J301 Allergic rhinitis due to pollen: Secondary | ICD-10-CM | POA: Diagnosis not present

## 2017-05-23 DIAGNOSIS — J301 Allergic rhinitis due to pollen: Secondary | ICD-10-CM | POA: Diagnosis not present

## 2017-05-23 DIAGNOSIS — J3089 Other allergic rhinitis: Secondary | ICD-10-CM | POA: Diagnosis not present

## 2017-05-30 DIAGNOSIS — J301 Allergic rhinitis due to pollen: Secondary | ICD-10-CM | POA: Diagnosis not present

## 2017-05-30 DIAGNOSIS — J3089 Other allergic rhinitis: Secondary | ICD-10-CM | POA: Diagnosis not present

## 2017-06-13 DIAGNOSIS — J301 Allergic rhinitis due to pollen: Secondary | ICD-10-CM | POA: Diagnosis not present

## 2017-06-13 DIAGNOSIS — J3089 Other allergic rhinitis: Secondary | ICD-10-CM | POA: Diagnosis not present

## 2017-06-20 DIAGNOSIS — J301 Allergic rhinitis due to pollen: Secondary | ICD-10-CM | POA: Diagnosis not present

## 2017-06-20 DIAGNOSIS — J3089 Other allergic rhinitis: Secondary | ICD-10-CM | POA: Diagnosis not present

## 2017-06-26 ENCOUNTER — Other Ambulatory Visit: Payer: BLUE CROSS/BLUE SHIELD | Admitting: Internal Medicine

## 2017-06-26 DIAGNOSIS — Z130 Encounter for screening for diseases of the blood and blood-forming organs and certain disorders involving the immune mechanism: Secondary | ICD-10-CM

## 2017-06-26 DIAGNOSIS — Z0000 Encounter for general adult medical examination without abnormal findings: Secondary | ICD-10-CM

## 2017-06-26 DIAGNOSIS — M8580 Other specified disorders of bone density and structure, unspecified site: Secondary | ICD-10-CM | POA: Diagnosis not present

## 2017-06-26 DIAGNOSIS — Z13220 Encounter for screening for lipoid disorders: Secondary | ICD-10-CM

## 2017-06-26 DIAGNOSIS — Z1329 Encounter for screening for other suspected endocrine disorder: Secondary | ICD-10-CM

## 2017-06-27 DIAGNOSIS — J301 Allergic rhinitis due to pollen: Secondary | ICD-10-CM | POA: Diagnosis not present

## 2017-06-27 DIAGNOSIS — J3089 Other allergic rhinitis: Secondary | ICD-10-CM | POA: Diagnosis not present

## 2017-06-27 LAB — COMPLETE METABOLIC PANEL WITH GFR
AG Ratio: 1.3 (calc) (ref 1.0–2.5)
ALT: 9 U/L (ref 6–29)
AST: 15 U/L (ref 10–35)
Albumin: 3.7 g/dL (ref 3.6–5.1)
Alkaline phosphatase (APISO): 50 U/L (ref 33–115)
BUN: 15 mg/dL (ref 7–25)
CO2: 25 mmol/L (ref 20–32)
Calcium: 9.1 mg/dL (ref 8.6–10.2)
Chloride: 104 mmol/L (ref 98–110)
Creat: 0.85 mg/dL (ref 0.50–1.10)
GFR, Est African American: 95 mL/min/{1.73_m2} (ref >=60)
GFR, Est Non African American: 82 mL/min/{1.73_m2} (ref >=60)
Globulin: 2.8 g/dL (calc) (ref 1.9–3.7)
Glucose, Bld: 78 mg/dL (ref 65–99)
Potassium: 4.4 mmol/L (ref 3.5–5.3)
Sodium: 138 mmol/L (ref 135–146)
Total Bilirubin: 1.4 mg/dL — ABNORMAL HIGH (ref 0.2–1.2)
Total Protein: 6.5 g/dL (ref 6.1–8.1)

## 2017-06-27 LAB — CBC WITH DIFFERENTIAL/PLATELET
Basophils Absolute: 70 cells/uL (ref 0–200)
Basophils Relative: 0.8 %
Eosinophils Absolute: 845 cells/uL — ABNORMAL HIGH (ref 15–500)
Eosinophils Relative: 9.6 %
HCT: 41.5 % (ref 35.0–45.0)
Hemoglobin: 13.8 g/dL (ref 11.7–15.5)
Lymphs Abs: 2710 cells/uL (ref 850–3900)
MCH: 32.4 pg (ref 27.0–33.0)
MCHC: 33.3 g/dL (ref 32.0–36.0)
MCV: 97.4 fL (ref 80.0–100.0)
MPV: 10.1 fL (ref 7.5–12.5)
Monocytes Relative: 6.3 %
Neutro Abs: 4620 cells/uL (ref 1500–7800)
Neutrophils Relative %: 52.5 %
Platelets: 316 10*3/uL (ref 140–400)
RBC: 4.26 10*6/uL (ref 3.80–5.10)
RDW: 11.8 % (ref 11.0–15.0)
Total Lymphocyte: 30.8 %
WBC mixed population: 554 cells/uL (ref 200–950)
WBC: 8.8 10*3/uL (ref 3.8–10.8)

## 2017-06-27 LAB — LIPID PANEL
Cholesterol: 176 mg/dL (ref <200)
HDL: 79 mg/dL (ref >50)
LDL Cholesterol (Calc): 82 mg/dL (calc)
Non-HDL Cholesterol (Calc): 97 mg/dL (calc) (ref <130)
Total CHOL/HDL Ratio: 2.2 (calc) (ref <5.0)
Triglycerides: 73 mg/dL (ref <150)

## 2017-06-27 LAB — TSH: TSH: 2.07 mIU/L

## 2017-06-27 LAB — VITAMIN D 25 HYDROXY (VIT D DEFICIENCY, FRACTURES): Vit D, 25-Hydroxy: 51 ng/mL (ref 30–100)

## 2017-06-30 DIAGNOSIS — J301 Allergic rhinitis due to pollen: Secondary | ICD-10-CM | POA: Diagnosis not present

## 2017-06-30 DIAGNOSIS — J3089 Other allergic rhinitis: Secondary | ICD-10-CM | POA: Diagnosis not present

## 2017-07-01 ENCOUNTER — Ambulatory Visit (INDEPENDENT_AMBULATORY_CARE_PROVIDER_SITE_OTHER): Payer: BLUE CROSS/BLUE SHIELD | Admitting: Internal Medicine

## 2017-07-01 ENCOUNTER — Encounter: Payer: Self-pay | Admitting: Internal Medicine

## 2017-07-01 VITALS — BP 102/60 | HR 80 | Temp 98.8°F | Ht 62.0 in | Wt 121.0 lb

## 2017-07-01 DIAGNOSIS — J3081 Allergic rhinitis due to animal (cat) (dog) hair and dander: Secondary | ICD-10-CM

## 2017-07-01 DIAGNOSIS — I8393 Asymptomatic varicose veins of bilateral lower extremities: Secondary | ICD-10-CM

## 2017-07-01 DIAGNOSIS — M8580 Other specified disorders of bone density and structure, unspecified site: Secondary | ICD-10-CM

## 2017-07-01 DIAGNOSIS — Z91013 Allergy to seafood: Secondary | ICD-10-CM | POA: Diagnosis not present

## 2017-07-01 DIAGNOSIS — Z0000 Encounter for general adult medical examination without abnormal findings: Secondary | ICD-10-CM | POA: Diagnosis not present

## 2017-07-01 DIAGNOSIS — Z23 Encounter for immunization: Secondary | ICD-10-CM | POA: Diagnosis not present

## 2017-07-01 DIAGNOSIS — J301 Allergic rhinitis due to pollen: Secondary | ICD-10-CM

## 2017-07-01 DIAGNOSIS — Z91010 Allergy to peanuts: Secondary | ICD-10-CM

## 2017-07-01 LAB — POCT URINALYSIS DIPSTICK
Bilirubin, UA: NEGATIVE
Blood, UA: NEGATIVE
Glucose, UA: NEGATIVE
Ketones, UA: NEGATIVE
Leukocytes, UA: NEGATIVE
Nitrite, UA: NEGATIVE
Protein, UA: NEGATIVE
Spec Grav, UA: 1.015 (ref 1.010–1.025)
Urobilinogen, UA: 0.2 E.U./dL
pH, UA: 6 (ref 5.0–8.0)

## 2017-07-01 NOTE — Progress Notes (Signed)
Subjective:    Patient ID: Tammy Hendrix, female    DOB: [birth year], 46 y.o.   MRN: [id number]  HPI.  Pleasant 46 year old white female in today for health maintenance exam and evaluation of medical issues.  Dr. Tressia Danas is GYN physician.  Patient needs medical clearance to be an elder in her church.  A letter needs to be written.  Patient has a history of familial osteoporosis, history of allergic rhinitis treated by the [facility].  She takes Claritin, uses albuterol as needed as well as steroid nasal spray.  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  Husband is an attorney with [employer].  She has 3 children, 2 daughters and a son.  Social alcohol consumption.  Does not smoke.  Family history: Father is a Engineer, drilling.  He has a history of hyperlipidemia, neuropathy, genetic osteoporosis, esophagitis and gastritis.  Mother with history of gallstones and allergic rhinitis as well as asthma.  Mother also has hypertension.  2 brothers in good health.  2 sisters in good health.  Patient has no known drug allergies.  No history of hospitalizations other than childbirth.  No history of operations.  She has eczema and asthma.  Takes vitamin D and calcium.  She had a bone density study April 2011 with T score of the LS spine -2.2 compared to 2005 when it was- 2.5.  Left femoral T score -2.1 and previously was -2.6.  That study in 2011 showed osteopenia rather than osteoporosis.  Do not see any recent bone density studies but may have been done by GYN physician.  Allergy testing done in 2010 was positive for dust mite, trees, grasses, shellfish, peanuts, dogs, cats and feathers.  Multiple varicosities and lower extremities.  She says this is hereditary.  Would like to see vascular surgeon regarding treatment.    Review of Systems  Constitutional: Negative.   Respiratory: Negative.   Cardiovascular: Negative.   Gastrointestinal: Negative.   Genitourinary: Negative.     Neurological: Negative.   Psychiatric/Behavioral: Negative.        Objective:   Physical Exam  Constitutional: She is oriented to person, place, and time. She appears well-developed and well-nourished. No distress.  HENT:  Head: Normocephalic and atraumatic.  Right Ear: External ear normal.  Left Ear: External ear normal.  Mouth/Throat: Oropharynx is clear and moist. No oropharyngeal exudate.  Eyes: Conjunctivae and EOM are normal. Right eye exhibits no discharge. Left eye exhibits no discharge. No scleral icterus.  Neck: Neck supple. No JVD present. No thyromegaly present.  Cardiovascular: Normal rate, normal heart sounds and intact distal pulses.   No murmur heard. Pulmonary/Chest: Effort normal and breath sounds normal. No respiratory distress. She has no wheezes. She has no rales. She exhibits no tenderness.  Breasts normal female without masses  Abdominal: Soft. Bowel sounds are normal. She exhibits no distension and no mass. There is no tenderness. There is no rebound and no guarding.  Genitourinary:  Genitourinary Comments: Deferred to GYN  Musculoskeletal: She exhibits no edema.  Multiple varicosities both lower extremities  Lymphadenopathy:    She has no cervical adenopathy.  Neurological: She is alert and oriented to person, place, and time. She has normal reflexes. No cranial nerve deficit. Coordination normal.  Skin: Skin is warm and dry. No rash noted. She is not diaphoretic.  Psychiatric: She has a normal mood and affect. Her behavior is normal. Judgment and thought content normal.  Vitals reviewed.  Assessment & Plan:  History of allergic rhinitis and asthma treated by [facility]  Varicose veins-to see vascular surgeon in the near future  Family history of osteoporosis and patient has documented osteopenia on bone density study 2011.  Try to follow-up with recent bone density study perhaps done by GYN physician.  Plan: Return in 1 year or as  needed.  Total bilirubin very mildly elevated at 1.4 previously 4 years ago was 1.0.  Suspect this is an anomaly and she should follow-up in 3 months here just to make sure.

## 2017-07-15 DIAGNOSIS — J301 Allergic rhinitis due to pollen: Secondary | ICD-10-CM | POA: Diagnosis not present

## 2017-07-15 DIAGNOSIS — J3089 Other allergic rhinitis: Secondary | ICD-10-CM | POA: Diagnosis not present

## 2017-07-20 NOTE — Patient Instructions (Signed)
It was a pleasure to see you today.  Flu vaccine given.  Continue allergy treatment as directed by allergist.  Follow-up with repeat liver functions in 3 months for mildly elevated bilirubin.  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

## 2017-07-21 ENCOUNTER — Encounter: Payer: Self-pay | Admitting: Vascular Surgery

## 2017-07-22 ENCOUNTER — Ambulatory Visit (INDEPENDENT_AMBULATORY_CARE_PROVIDER_SITE_OTHER): Payer: BLUE CROSS/BLUE SHIELD | Admitting: Vascular Surgery

## 2017-07-22 ENCOUNTER — Encounter: Payer: Self-pay | Admitting: Vascular Surgery

## 2017-07-22 VITALS — BP 124/79 | HR 72 | Resp 18 | Ht 62.0 in | Wt 121.9 lb

## 2017-07-22 DIAGNOSIS — I83893 Varicose veins of bilateral lower extremities with other complications: Secondary | ICD-10-CM

## 2017-07-22 NOTE — Progress Notes (Signed)
Subjective:     Patient ID: Tammy Hendrix, female   DOB: May 14, 1971, 46 y.o.   MRN: [id number]  HPI This 46 year old female was referred by Dr. Tommie Ard Baxley for evaluation of possible varicose veins both legs. Patient has noted some prominent veins in the posterior right calf and has some aching and burning discomfort as the day progresses which is relieved by elevation. She also has had some prominent veins in the posterior left calf since she was a teenager and these have gradually enlarged slightly. She has no history of DVT thrombophlebitis stasis ulcers or bleeding. She does not were elastic compression stockings.  Past Medical History:  Diagnosis Date  . Asthma   . Dermatitis     Social History  Substance Use Topics  . Smoking status: Never Smoker  . Smokeless tobacco: Never Used  . Alcohol use Yes     Comment: occas    Family History  Problem Relation Age of Onset  . Hyperlipidemia Mother   . Thyroid disease Mother   . Hyperlipidemia Father   . Bipolar disorder Father   . Neuropathy Father     Allergies  Allergen Reactions  . Peanuts [Peanut Oil] Rash  . Shellfish Allergy Diarrhea     Current Outpatient Prescriptions:  .  albuterol (PROVENTIL HFA;VENTOLIN HFA) 108 (90 BASE) MCG/ACT inhaler, Inhale 2 puffs into the lungs every 6 (six) hours as needed., Disp: 1 Inhaler, Rfl: 0 .  cetirizine (ZYRTEC) 10 MG tablet, Take 10 mg by mouth daily., Disp: , Rfl:  .  Norethin-Eth Estradiol-Fe [facility] FE,WYMZYA FE,ZENCHENT FE,ZEOSA) 0.4-35 MG-MCG tablet, Chew 1 tablet by mouth daily., Disp: , Rfl:  .  triamcinolone (NASACORT) 55 MCG/ACT nasal inhaler, Place 2 sprays into the nose daily., Disp: 1 Inhaler, Rfl: 0  Vitals:   07/22/17 1415  BP: 124/79  Pulse: 72  Resp: 18  SpO2: 100%  Weight: 121 lb 14.4 oz (55.3 kg)  Height: 5\' 2"  (1.575 m)    Body mass index is 22.3 kg/m.         Review of Systems Denies chest pain, dyspnea on exertion, PND, orthopnea. Has  history of skin rashes.    Objective:   Physical Exam BP 124/79 (BP Location: Left Arm, Patient Position: Sitting, Cuff Size: Normal)   Pulse 72   Resp 18   Ht 5\' 2"  (1.575 m)   Wt 121 lb 14.4 oz (55.3 kg)   SpO2 100%   BMI 22.30 kg/m     Gen.-alert and oriented x3 in no apparent distress HEENT normal for age Lungs no rhonchi or wheezing Cardiovascular regular rhythm no murmurs carotid pulses 3+ palpable no bruits audible Abdomen soft nontender no palpable masses Musculoskeletal free of  major deformities Skin clear -no rashes Neurologic normal Lower extremities 3+ femoral and dorsalis pedis pulses palpable bilaterally with no edema Right leg has network of reticular veins and posterior calf extending up towards popliteal fossa was no bulging varicosities Left leg has some localized small varicosities which communicate up toward the popliteal fossa. No hyperpigmentation or ulceration noted in either leg.  Today I visualized the great and small saphenous veins in both lower extremities with the bedside SonoSite ultrasound. There is no evidence of enlargement or gross reflux in either of those venous systems       Assessment:     Reticular veins and small varicose vein left posterior calf and right posterior calf with no evidence gross reflux in right or left great or small  saphenous veins    Plan:     There is no indication for laser ablation or other significant intervention in this patient Treatment would be foam sclerotherapy if she elected to have that performed. I discussed and there is potential for recurrence after this treatment and that it is not medically necessary. I offered to give her contact information with Richelle Ito  who performs that in our office He will be back in touch if she would like further information

## 2017-08-05 ENCOUNTER — Encounter: Payer: Self-pay | Admitting: Vascular Surgery

## 2017-08-05 DIAGNOSIS — J301 Allergic rhinitis due to pollen: Secondary | ICD-10-CM | POA: Diagnosis not present

## 2017-08-05 DIAGNOSIS — J3089 Other allergic rhinitis: Secondary | ICD-10-CM | POA: Diagnosis not present

## 2017-08-11 DIAGNOSIS — J301 Allergic rhinitis due to pollen: Secondary | ICD-10-CM | POA: Diagnosis not present

## 2017-08-11 DIAGNOSIS — J3089 Other allergic rhinitis: Secondary | ICD-10-CM | POA: Diagnosis not present

## 2017-08-18 DIAGNOSIS — J3089 Other allergic rhinitis: Secondary | ICD-10-CM | POA: Diagnosis not present

## 2017-08-18 DIAGNOSIS — J301 Allergic rhinitis due to pollen: Secondary | ICD-10-CM | POA: Diagnosis not present

## 2017-08-25 DIAGNOSIS — J3089 Other allergic rhinitis: Secondary | ICD-10-CM | POA: Diagnosis not present

## 2017-09-10 ENCOUNTER — Encounter: Payer: Self-pay | Admitting: Vascular Surgery

## 2017-09-10 DIAGNOSIS — J3089 Other allergic rhinitis: Secondary | ICD-10-CM | POA: Diagnosis not present

## 2017-09-10 DIAGNOSIS — J301 Allergic rhinitis due to pollen: Secondary | ICD-10-CM | POA: Diagnosis not present

## 2017-09-17 DIAGNOSIS — J3089 Other allergic rhinitis: Secondary | ICD-10-CM | POA: Diagnosis not present

## 2017-10-01 DIAGNOSIS — J3089 Other allergic rhinitis: Secondary | ICD-10-CM | POA: Diagnosis not present

## 2017-10-01 DIAGNOSIS — J301 Allergic rhinitis due to pollen: Secondary | ICD-10-CM | POA: Diagnosis not present

## 2017-10-20 DIAGNOSIS — J301 Allergic rhinitis due to pollen: Secondary | ICD-10-CM | POA: Diagnosis not present

## 2017-10-20 DIAGNOSIS — J3089 Other allergic rhinitis: Secondary | ICD-10-CM | POA: Diagnosis not present

## 2017-11-03 DIAGNOSIS — J3089 Other allergic rhinitis: Secondary | ICD-10-CM | POA: Diagnosis not present

## 2017-11-03 DIAGNOSIS — J301 Allergic rhinitis due to pollen: Secondary | ICD-10-CM | POA: Diagnosis not present

## 2017-11-24 DIAGNOSIS — J301 Allergic rhinitis due to pollen: Secondary | ICD-10-CM | POA: Diagnosis not present

## 2017-11-24 DIAGNOSIS — J3089 Other allergic rhinitis: Secondary | ICD-10-CM | POA: Diagnosis not present

## 2017-12-17 ENCOUNTER — Other Ambulatory Visit: Payer: Self-pay | Admitting: Internal Medicine

## 2017-12-17 DIAGNOSIS — R17 Unspecified jaundice: Secondary | ICD-10-CM

## 2017-12-26 DIAGNOSIS — J301 Allergic rhinitis due to pollen: Secondary | ICD-10-CM | POA: Diagnosis not present

## 2017-12-26 DIAGNOSIS — J3089 Other allergic rhinitis: Secondary | ICD-10-CM | POA: Diagnosis not present

## 2017-12-29 ENCOUNTER — Other Ambulatory Visit: Payer: BLUE CROSS/BLUE SHIELD | Admitting: Internal Medicine

## 2017-12-29 DIAGNOSIS — R17 Unspecified jaundice: Secondary | ICD-10-CM

## 2017-12-29 LAB — HEPATIC FUNCTION PANEL
AG Ratio: 1.3 (calc) (ref 1.0–2.5)
ALT: 10 U/L (ref 6–29)
AST: 16 U/L (ref 10–35)
Albumin: 3.6 g/dL (ref 3.6–5.1)
Alkaline phosphatase (APISO): 45 U/L (ref 33–115)
Bilirubin, Direct: 0.2 mg/dL (ref 0.0–0.2)
Globulin: 2.7 g/dL (calc) (ref 1.9–3.7)
Indirect Bilirubin: 0.7 mg/dL (calc) (ref 0.2–1.2)
Total Bilirubin: 0.9 mg/dL (ref 0.2–1.2)
Total Protein: 6.3 g/dL (ref 6.1–8.1)

## 2018-02-02 DIAGNOSIS — J3089 Other allergic rhinitis: Secondary | ICD-10-CM | POA: Diagnosis not present

## 2018-02-02 DIAGNOSIS — J301 Allergic rhinitis due to pollen: Secondary | ICD-10-CM | POA: Diagnosis not present

## 2018-02-06 DIAGNOSIS — J3089 Other allergic rhinitis: Secondary | ICD-10-CM | POA: Diagnosis not present

## 2018-02-06 DIAGNOSIS — J301 Allergic rhinitis due to pollen: Secondary | ICD-10-CM | POA: Diagnosis not present

## 2018-02-20 DIAGNOSIS — J3089 Other allergic rhinitis: Secondary | ICD-10-CM | POA: Diagnosis not present

## 2018-02-20 DIAGNOSIS — J301 Allergic rhinitis due to pollen: Secondary | ICD-10-CM | POA: Diagnosis not present

## 2018-02-25 DIAGNOSIS — J301 Allergic rhinitis due to pollen: Secondary | ICD-10-CM | POA: Diagnosis not present

## 2018-02-25 DIAGNOSIS — L209 Atopic dermatitis, unspecified: Secondary | ICD-10-CM | POA: Diagnosis not present

## 2018-02-25 DIAGNOSIS — J3089 Other allergic rhinitis: Secondary | ICD-10-CM | POA: Diagnosis not present

## 2018-02-25 DIAGNOSIS — J4520 Mild intermittent asthma, uncomplicated: Secondary | ICD-10-CM | POA: Diagnosis not present

## 2018-03-30 DIAGNOSIS — J301 Allergic rhinitis due to pollen: Secondary | ICD-10-CM | POA: Diagnosis not present

## 2018-03-31 DIAGNOSIS — J3089 Other allergic rhinitis: Secondary | ICD-10-CM | POA: Diagnosis not present

## 2018-03-31 DIAGNOSIS — J301 Allergic rhinitis due to pollen: Secondary | ICD-10-CM | POA: Diagnosis not present

## 2018-04-21 DIAGNOSIS — J3089 Other allergic rhinitis: Secondary | ICD-10-CM | POA: Diagnosis not present

## 2018-04-21 DIAGNOSIS — J301 Allergic rhinitis due to pollen: Secondary | ICD-10-CM | POA: Diagnosis not present

## 2018-04-27 DIAGNOSIS — J301 Allergic rhinitis due to pollen: Secondary | ICD-10-CM | POA: Diagnosis not present

## 2018-04-27 DIAGNOSIS — J3089 Other allergic rhinitis: Secondary | ICD-10-CM | POA: Diagnosis not present

## 2018-05-04 DIAGNOSIS — J301 Allergic rhinitis due to pollen: Secondary | ICD-10-CM | POA: Diagnosis not present

## 2018-05-04 DIAGNOSIS — J3089 Other allergic rhinitis: Secondary | ICD-10-CM | POA: Diagnosis not present

## 2018-05-18 DIAGNOSIS — J301 Allergic rhinitis due to pollen: Secondary | ICD-10-CM | POA: Diagnosis not present

## 2018-05-18 DIAGNOSIS — J3089 Other allergic rhinitis: Secondary | ICD-10-CM | POA: Diagnosis not present

## 2018-05-19 DIAGNOSIS — Z6822 Body mass index (BMI) 22.0-22.9, adult: Secondary | ICD-10-CM | POA: Diagnosis not present

## 2018-05-19 DIAGNOSIS — Z01419 Encounter for gynecological examination (general) (routine) without abnormal findings: Secondary | ICD-10-CM | POA: Diagnosis not present

## 2018-05-19 DIAGNOSIS — Z1231 Encounter for screening mammogram for malignant neoplasm of breast: Secondary | ICD-10-CM | POA: Diagnosis not present

## 2018-05-29 DIAGNOSIS — J3089 Other allergic rhinitis: Secondary | ICD-10-CM | POA: Diagnosis not present

## 2018-05-29 DIAGNOSIS — J301 Allergic rhinitis due to pollen: Secondary | ICD-10-CM | POA: Diagnosis not present

## 2018-06-08 DIAGNOSIS — J3089 Other allergic rhinitis: Secondary | ICD-10-CM | POA: Diagnosis not present

## 2018-06-08 DIAGNOSIS — J301 Allergic rhinitis due to pollen: Secondary | ICD-10-CM | POA: Diagnosis not present

## 2018-06-19 DIAGNOSIS — J301 Allergic rhinitis due to pollen: Secondary | ICD-10-CM | POA: Diagnosis not present

## 2018-06-19 DIAGNOSIS — J3089 Other allergic rhinitis: Secondary | ICD-10-CM | POA: Diagnosis not present

## 2018-07-10 DIAGNOSIS — J3089 Other allergic rhinitis: Secondary | ICD-10-CM | POA: Diagnosis not present

## 2018-07-10 DIAGNOSIS — J301 Allergic rhinitis due to pollen: Secondary | ICD-10-CM | POA: Diagnosis not present

## 2018-07-24 DIAGNOSIS — J301 Allergic rhinitis due to pollen: Secondary | ICD-10-CM | POA: Diagnosis not present

## 2018-07-24 DIAGNOSIS — J3089 Other allergic rhinitis: Secondary | ICD-10-CM | POA: Diagnosis not present

## 2018-07-31 DIAGNOSIS — J3089 Other allergic rhinitis: Secondary | ICD-10-CM | POA: Diagnosis not present

## 2018-07-31 DIAGNOSIS — J301 Allergic rhinitis due to pollen: Secondary | ICD-10-CM | POA: Diagnosis not present

## 2018-08-07 DIAGNOSIS — J3089 Other allergic rhinitis: Secondary | ICD-10-CM | POA: Diagnosis not present

## 2018-08-07 DIAGNOSIS — J301 Allergic rhinitis due to pollen: Secondary | ICD-10-CM | POA: Diagnosis not present

## 2018-08-14 DIAGNOSIS — J3089 Other allergic rhinitis: Secondary | ICD-10-CM | POA: Diagnosis not present

## 2018-08-14 DIAGNOSIS — J301 Allergic rhinitis due to pollen: Secondary | ICD-10-CM | POA: Diagnosis not present

## 2018-09-14 DIAGNOSIS — J301 Allergic rhinitis due to pollen: Secondary | ICD-10-CM | POA: Diagnosis not present

## 2018-09-14 DIAGNOSIS — J3089 Other allergic rhinitis: Secondary | ICD-10-CM | POA: Diagnosis not present

## 2018-10-23 DIAGNOSIS — J3089 Other allergic rhinitis: Secondary | ICD-10-CM | POA: Diagnosis not present

## 2018-10-23 DIAGNOSIS — J301 Allergic rhinitis due to pollen: Secondary | ICD-10-CM | POA: Diagnosis not present

## 2018-10-28 DIAGNOSIS — J301 Allergic rhinitis due to pollen: Secondary | ICD-10-CM | POA: Diagnosis not present

## 2018-10-28 DIAGNOSIS — J3089 Other allergic rhinitis: Secondary | ICD-10-CM | POA: Diagnosis not present

## 2018-10-29 DIAGNOSIS — L2089 Other atopic dermatitis: Secondary | ICD-10-CM | POA: Diagnosis not present

## 2018-10-29 DIAGNOSIS — L821 Other seborrheic keratosis: Secondary | ICD-10-CM | POA: Diagnosis not present

## 2018-10-29 DIAGNOSIS — L218 Other seborrheic dermatitis: Secondary | ICD-10-CM | POA: Diagnosis not present

## 2018-10-29 DIAGNOSIS — L570 Actinic keratosis: Secondary | ICD-10-CM | POA: Diagnosis not present

## 2018-10-29 DIAGNOSIS — D485 Neoplasm of uncertain behavior of skin: Secondary | ICD-10-CM | POA: Diagnosis not present

## 2018-11-04 DIAGNOSIS — J3089 Other allergic rhinitis: Secondary | ICD-10-CM | POA: Diagnosis not present

## 2018-11-04 DIAGNOSIS — J301 Allergic rhinitis due to pollen: Secondary | ICD-10-CM | POA: Diagnosis not present

## 2018-11-11 DIAGNOSIS — J3089 Other allergic rhinitis: Secondary | ICD-10-CM | POA: Diagnosis not present

## 2018-11-11 DIAGNOSIS — J301 Allergic rhinitis due to pollen: Secondary | ICD-10-CM | POA: Diagnosis not present

## 2018-11-25 DIAGNOSIS — J301 Allergic rhinitis due to pollen: Secondary | ICD-10-CM | POA: Diagnosis not present

## 2018-11-25 DIAGNOSIS — J3089 Other allergic rhinitis: Secondary | ICD-10-CM | POA: Diagnosis not present

## 2018-12-02 DIAGNOSIS — J3089 Other allergic rhinitis: Secondary | ICD-10-CM | POA: Diagnosis not present

## 2018-12-02 DIAGNOSIS — J301 Allergic rhinitis due to pollen: Secondary | ICD-10-CM | POA: Diagnosis not present

## 2018-12-15 DIAGNOSIS — J3089 Other allergic rhinitis: Secondary | ICD-10-CM | POA: Diagnosis not present

## 2018-12-15 DIAGNOSIS — J301 Allergic rhinitis due to pollen: Secondary | ICD-10-CM | POA: Diagnosis not present

## 2019-02-16 DIAGNOSIS — J301 Allergic rhinitis due to pollen: Secondary | ICD-10-CM | POA: Diagnosis not present

## 2019-02-17 DIAGNOSIS — J301 Allergic rhinitis due to pollen: Secondary | ICD-10-CM | POA: Diagnosis not present

## 2019-02-25 DIAGNOSIS — Z1159 Encounter for screening for other viral diseases: Secondary | ICD-10-CM | POA: Diagnosis not present

## 2019-03-16 DIAGNOSIS — J301 Allergic rhinitis due to pollen: Secondary | ICD-10-CM | POA: Diagnosis not present

## 2019-03-16 DIAGNOSIS — J3089 Other allergic rhinitis: Secondary | ICD-10-CM | POA: Diagnosis not present

## 2019-03-23 DIAGNOSIS — J3089 Other allergic rhinitis: Secondary | ICD-10-CM | POA: Diagnosis not present

## 2019-03-23 DIAGNOSIS — J301 Allergic rhinitis due to pollen: Secondary | ICD-10-CM | POA: Diagnosis not present

## 2019-04-01 DIAGNOSIS — J3089 Other allergic rhinitis: Secondary | ICD-10-CM | POA: Diagnosis not present

## 2019-04-01 DIAGNOSIS — J301 Allergic rhinitis due to pollen: Secondary | ICD-10-CM | POA: Diagnosis not present

## 2019-04-06 DIAGNOSIS — J3089 Other allergic rhinitis: Secondary | ICD-10-CM | POA: Diagnosis not present

## 2019-04-06 DIAGNOSIS — J301 Allergic rhinitis due to pollen: Secondary | ICD-10-CM | POA: Diagnosis not present

## 2019-04-13 DIAGNOSIS — J301 Allergic rhinitis due to pollen: Secondary | ICD-10-CM | POA: Diagnosis not present

## 2019-04-13 DIAGNOSIS — J3089 Other allergic rhinitis: Secondary | ICD-10-CM | POA: Diagnosis not present

## 2019-04-20 DIAGNOSIS — J3089 Other allergic rhinitis: Secondary | ICD-10-CM | POA: Diagnosis not present

## 2019-04-20 DIAGNOSIS — J301 Allergic rhinitis due to pollen: Secondary | ICD-10-CM | POA: Diagnosis not present

## 2019-05-04 DIAGNOSIS — J3089 Other allergic rhinitis: Secondary | ICD-10-CM | POA: Diagnosis not present

## 2019-05-04 DIAGNOSIS — J301 Allergic rhinitis due to pollen: Secondary | ICD-10-CM | POA: Diagnosis not present

## 2019-05-27 DIAGNOSIS — J301 Allergic rhinitis due to pollen: Secondary | ICD-10-CM | POA: Diagnosis not present

## 2019-05-27 DIAGNOSIS — J3089 Other allergic rhinitis: Secondary | ICD-10-CM | POA: Diagnosis not present

## 2019-06-02 DIAGNOSIS — J301 Allergic rhinitis due to pollen: Secondary | ICD-10-CM | POA: Diagnosis not present

## 2019-06-02 DIAGNOSIS — J4520 Mild intermittent asthma, uncomplicated: Secondary | ICD-10-CM | POA: Diagnosis not present

## 2019-06-02 DIAGNOSIS — J3089 Other allergic rhinitis: Secondary | ICD-10-CM | POA: Diagnosis not present

## 2019-06-02 DIAGNOSIS — L209 Atopic dermatitis, unspecified: Secondary | ICD-10-CM | POA: Diagnosis not present

## 2019-06-23 DIAGNOSIS — J3089 Other allergic rhinitis: Secondary | ICD-10-CM | POA: Diagnosis not present

## 2019-06-23 DIAGNOSIS — J301 Allergic rhinitis due to pollen: Secondary | ICD-10-CM | POA: Diagnosis not present

## 2019-06-30 DIAGNOSIS — N952 Postmenopausal atrophic vaginitis: Secondary | ICD-10-CM | POA: Diagnosis not present

## 2019-06-30 DIAGNOSIS — Z01419 Encounter for gynecological examination (general) (routine) without abnormal findings: Secondary | ICD-10-CM | POA: Diagnosis not present

## 2019-06-30 DIAGNOSIS — Z1231 Encounter for screening mammogram for malignant neoplasm of breast: Secondary | ICD-10-CM | POA: Diagnosis not present

## 2019-06-30 DIAGNOSIS — Z6821 Body mass index (BMI) 21.0-21.9, adult: Secondary | ICD-10-CM | POA: Diagnosis not present

## 2019-07-16 DIAGNOSIS — J301 Allergic rhinitis due to pollen: Secondary | ICD-10-CM | POA: Diagnosis not present

## 2019-07-16 DIAGNOSIS — J3089 Other allergic rhinitis: Secondary | ICD-10-CM | POA: Diagnosis not present

## 2019-07-23 DIAGNOSIS — J301 Allergic rhinitis due to pollen: Secondary | ICD-10-CM | POA: Diagnosis not present

## 2019-07-23 DIAGNOSIS — J3089 Other allergic rhinitis: Secondary | ICD-10-CM | POA: Diagnosis not present

## 2019-07-29 DIAGNOSIS — J3089 Other allergic rhinitis: Secondary | ICD-10-CM | POA: Diagnosis not present

## 2019-07-29 DIAGNOSIS — J301 Allergic rhinitis due to pollen: Secondary | ICD-10-CM | POA: Diagnosis not present

## 2019-08-05 DIAGNOSIS — T50905A Adverse effect of unspecified drugs, medicaments and biological substances, initial encounter: Secondary | ICD-10-CM | POA: Diagnosis not present

## 2019-08-13 DIAGNOSIS — J301 Allergic rhinitis due to pollen: Secondary | ICD-10-CM | POA: Diagnosis not present

## 2019-08-13 DIAGNOSIS — J3089 Other allergic rhinitis: Secondary | ICD-10-CM | POA: Diagnosis not present

## 2019-08-16 DIAGNOSIS — J301 Allergic rhinitis due to pollen: Secondary | ICD-10-CM | POA: Diagnosis not present

## 2019-08-16 DIAGNOSIS — J3089 Other allergic rhinitis: Secondary | ICD-10-CM | POA: Diagnosis not present

## 2019-09-09 ENCOUNTER — Ambulatory Visit: Payer: BC Managed Care – PPO | Attending: Internal Medicine

## 2019-09-09 ENCOUNTER — Other Ambulatory Visit: Payer: Self-pay

## 2019-09-09 ENCOUNTER — Other Ambulatory Visit: Payer: Self-pay | Admitting: Internal Medicine

## 2019-09-09 DIAGNOSIS — J4520 Mild intermittent asthma, uncomplicated: Secondary | ICD-10-CM | POA: Diagnosis not present

## 2019-09-09 DIAGNOSIS — J301 Allergic rhinitis due to pollen: Secondary | ICD-10-CM | POA: Diagnosis not present

## 2019-09-09 DIAGNOSIS — J4521 Mild intermittent asthma with (acute) exacerbation: Secondary | ICD-10-CM | POA: Diagnosis not present

## 2019-09-09 DIAGNOSIS — Z20822 Contact with and (suspected) exposure to covid-19: Secondary | ICD-10-CM

## 2019-09-09 DIAGNOSIS — R05 Cough: Secondary | ICD-10-CM | POA: Diagnosis not present

## 2019-09-10 ENCOUNTER — Other Ambulatory Visit: Payer: BLUE CROSS/BLUE SHIELD

## 2019-09-10 LAB — NOVEL CORONAVIRUS, NAA: SARS-CoV-2, NAA: NOT DETECTED

## 2019-09-13 ENCOUNTER — Telehealth: Payer: Self-pay | Admitting: Internal Medicine

## 2019-09-13 ENCOUNTER — Other Ambulatory Visit: Payer: Self-pay

## 2019-09-13 ENCOUNTER — Ambulatory Visit (INDEPENDENT_AMBULATORY_CARE_PROVIDER_SITE_OTHER): Payer: BC Managed Care – PPO | Admitting: Internal Medicine

## 2019-09-13 ENCOUNTER — Encounter: Payer: Self-pay | Admitting: Internal Medicine

## 2019-09-13 ENCOUNTER — Other Ambulatory Visit: Payer: BLUE CROSS/BLUE SHIELD | Admitting: Internal Medicine

## 2019-09-13 DIAGNOSIS — R05 Cough: Secondary | ICD-10-CM | POA: Diagnosis not present

## 2019-09-13 DIAGNOSIS — R059 Cough, unspecified: Secondary | ICD-10-CM

## 2019-09-13 NOTE — Patient Instructions (Signed)
She will finish tapering course of prednisone prescribed by allergist.  If not improving by Wednesday, December 23 she will recontact me and I will call in an antibiotic.  We will move her physical exam appointment to sometime in January.

## 2019-09-13 NOTE — Telephone Encounter (Signed)
Scheduled

## 2019-09-13 NOTE — Progress Notes (Signed)
   Subjective:    Patient ID: Tammy Hendrix, female    DOB: 10/01/1970, 48 y.o.   MRN: [id number]  HPI She has been coughing for a few days and saw allergist recently via virtual visit and was placed on Prednisone. He advised Covid19 testing which was negative. Feels that she is exposed to house dest and pollen but is heard today on virtual visit coughing a bit. No one else at home is symptomatic.  She was scheduled for CPE this week but will defer that for now. She is finishing up Prednisone taper and will let me know if not significantly improved by Wednesday. If not improving, I will call her in an antibiotic.  Due to the Coronavirus pandemic, she is seen today by interactive audio and video telecommunications.  She is identified using 2 identifiers as Tammy Hendrix. Rosana Hoes, a patient in this practice.  She is agreeable to visit in this format today.  She is heard to be coughing a little bit during this interview.  She has no fever, chills, nausea, vomiting, dysgeusia, or myalgias.  She feels that she has postnasal drip related to allergy issues that is triggering her cough    Review of Systems no other complaints     Objective:   Physical Exam  Reports she is afebrile      Assessment & Plan:  Cough-?  Allergy related or viral illness with secondary bacterial infection  Plan: If not improving after finishing tapering course of prednisone we will prescribe antibiotic.  She will let me know Wednesday if not improving.  For now we will cancel her health maintenance exam and try to schedule it in January when her cough has resolved.

## 2019-09-13 NOTE — Telephone Encounter (Signed)
Let's do virtual visit today

## 2019-09-13 NOTE — Telephone Encounter (Signed)
Tammy Hendrix [phone]  Tammy Hendrix called to say that she had a negative COVID test, her allergist wanted her to be tested because of a cough. COVID was negative. Tammy Hendrix still has a cough, so she called to see if she could still come in tomorrow for her CPE labs, CPE is scheduled for 09/20/19. No other symptoms and no fever, no COVID exposure that she knows of.

## 2019-09-14 ENCOUNTER — Other Ambulatory Visit: Payer: Self-pay | Admitting: Internal Medicine

## 2019-09-15 ENCOUNTER — Telehealth: Payer: Self-pay

## 2019-09-15 ENCOUNTER — Telehealth: Payer: Self-pay | Admitting: Internal Medicine

## 2019-09-15 DIAGNOSIS — J22 Unspecified acute lower respiratory infection: Secondary | ICD-10-CM

## 2019-09-15 MED ORDER — AZITHROMYCIN 250 MG PO TABS: ORAL_TABLET | ORAL | 0 refills | Status: DC

## 2019-09-15 NOTE — Telephone Encounter (Signed)
Scheduled

## 2019-09-15 NOTE — Telephone Encounter (Signed)
Tell her I am sending in [place]

## 2019-09-15 NOTE — Telephone Encounter (Signed)
As per virtual visit recently, I am sending in Zithromax Z pak 2 po day 1 followed by one po days 2-5

## 2019-09-15 NOTE — Telephone Encounter (Signed)
Patient called finished the prednisone yesterday she is not better. She would like to get an antibiotic.   [facility].

## 2019-09-15 NOTE — Telephone Encounter (Signed)
LVM to CB and reshcedule

## 2019-09-20 ENCOUNTER — Encounter: Payer: BLUE CROSS/BLUE SHIELD | Admitting: Internal Medicine

## 2019-10-04 DIAGNOSIS — J301 Allergic rhinitis due to pollen: Secondary | ICD-10-CM | POA: Diagnosis not present

## 2019-10-04 DIAGNOSIS — J3089 Other allergic rhinitis: Secondary | ICD-10-CM | POA: Diagnosis not present

## 2019-10-13 ENCOUNTER — Encounter: Payer: Self-pay | Admitting: Internal Medicine

## 2019-10-13 ENCOUNTER — Ambulatory Visit (INDEPENDENT_AMBULATORY_CARE_PROVIDER_SITE_OTHER): Payer: BC Managed Care – PPO | Admitting: Internal Medicine

## 2019-10-13 ENCOUNTER — Other Ambulatory Visit: Payer: Self-pay

## 2019-10-13 VITALS — BP 120/80 | HR 71 | Temp 98.0°F

## 2019-10-13 DIAGNOSIS — J301 Allergic rhinitis due to pollen: Secondary | ICD-10-CM

## 2019-10-13 DIAGNOSIS — Z13220 Encounter for screening for lipoid disorders: Secondary | ICD-10-CM | POA: Diagnosis not present

## 2019-10-13 DIAGNOSIS — E7800 Pure hypercholesterolemia, unspecified: Secondary | ICD-10-CM | POA: Diagnosis not present

## 2019-10-13 DIAGNOSIS — Z0000 Encounter for general adult medical examination without abnormal findings: Secondary | ICD-10-CM | POA: Diagnosis not present

## 2019-10-13 DIAGNOSIS — R8290 Unspecified abnormal findings in urine: Secondary | ICD-10-CM | POA: Diagnosis not present

## 2019-10-13 DIAGNOSIS — Z20822 Contact with and (suspected) exposure to covid-19: Secondary | ICD-10-CM

## 2019-10-13 DIAGNOSIS — Z1329 Encounter for screening for other suspected endocrine disorder: Secondary | ICD-10-CM | POA: Diagnosis not present

## 2019-10-13 DIAGNOSIS — M8580 Other specified disorders of bone density and structure, unspecified site: Secondary | ICD-10-CM | POA: Diagnosis not present

## 2019-10-13 DIAGNOSIS — Z1321 Encounter for screening for nutritional disorder: Secondary | ICD-10-CM

## 2019-10-13 LAB — POCT URINALYSIS DIPSTICK
Bilirubin, UA: NEGATIVE
Glucose, UA: NEGATIVE
Ketones, UA: NEGATIVE
Nitrite, UA: NEGATIVE
Protein, UA: NEGATIVE
Spec Grav, UA: 1.01 (ref 1.010–1.025)
Urobilinogen, UA: 0.2 E.U./dL
pH, UA: 6 (ref 5.0–8.0)

## 2019-10-13 NOTE — Progress Notes (Signed)
   Subjective:    Patient ID: Tammy Hendrix, female    DOB: 01/11/1971, 49 y.o.   MRN: [id number]  HPI  Pleasant 49 year old Female for health maintenance exam and evalaution of medical issues.  Dr. Hazle Coca is GYN physician.  Patient is working on a Masters degree in religious education.  She is a Agricultural engineer.  Husband is an Forensic psychologist.  She does not smoke.    She has 3 children, 2 daughters and a son.  1 daughter is at [employer] [facility] [employer].  Social alcohol consumption.  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  Family history: Father is a Engineer, drilling.  He has history of hyperlipidemia, neuropathy genetic osteoporosis, esophagitis and gastritis.  Mother with history of gallstones and allergic rhinitis as well as asthma.  Mother has hypertension.  2 brothers in good health and 2 sisters in good health.  Patient has no known drug allergies.  No history of hospitalizations other than childbirth.  No operations.  She has eczema and asthma.  She takes vitamin D and calcium.  She has a history of familial osteoporosis in her father, allergic rhinitis treated by [facility].  She takes Claritin, uses albuterol inhaler as well as steroid nasal spray.  She had met density study April 2011 with T score of the LS spine -2.2 compared to 2005 when it was -2.5.  Should consider bone density study in the near future.  Allergy testing done in 2010 was positive for dust mite, trees, grasses, shellfish peanuts dogs cats and feathers.  Multiple varicosities on the lower extremities and has seen vascular surgeon.  These were not thought to be serious.    Review of Systems no new complaints     Objective:   Physical Exam Vital signs reviewed.  Skin warm and dry.  Nodes none.  TMs clear.  Neck is supple without thyromegaly.  Chest clear.  Cardiac exam regular rate and rhythm normal S1 and S2.  Abdomen no hepatosplenomegaly masses or tenderness.  GYN exam deferred to GYN physician.  No lower extremity edema.  Has superficial varicosities  lower legs.       Assessment & Plan:  History of osteopenia and family history of osteoporosis.  Vitamin D is low normal at 31.  Continue vitamin D supplement daily.  Allergic rhinitis and asthma treated by allergist.  Has albuterol inhaler if needed.  Superficial varicosities lower extremities are benign  Lipid panel reviewed and is essentially normal.  Total cholesterol is 202 and LDL is slightly elevated at 106 which is not bad given the pandemic also, she had COVID-19 antibody drawn which was negative.  Plan: I think she needs to have bone density study in the near future given her history.  Take vitamin D supplement 4000 to 5000 units daily.  Otherwise I would like to see her again in 1 year.

## 2019-10-14 LAB — CBC WITH DIFFERENTIAL/PLATELET
Absolute Monocytes: 582 cells/uL (ref 200–950)
Basophils Absolute: 74 cells/uL (ref 0–200)
Basophils Relative: 0.9 %
Eosinophils Absolute: 369 cells/uL (ref 15–500)
Eosinophils Relative: 4.5 %
HCT: 41.3 % (ref 35.0–45.0)
Hemoglobin: 13.8 g/dL (ref 11.7–15.5)
Lymphs Abs: 2673 cells/uL (ref 850–3900)
MCH: 32.9 pg (ref 27.0–33.0)
MCHC: 33.4 g/dL (ref 32.0–36.0)
MCV: 98.3 fL (ref 80.0–100.0)
MPV: 9.9 fL (ref 7.5–12.5)
Monocytes Relative: 7.1 %
Neutro Abs: 4502 cells/uL (ref 1500–7800)
Neutrophils Relative %: 54.9 %
Platelets: 404 10*3/uL — ABNORMAL HIGH (ref 140–400)
RBC: 4.2 10*6/uL (ref 3.80–5.10)
RDW: 11.7 % (ref 11.0–15.0)
Total Lymphocyte: 32.6 %
WBC: 8.2 10*3/uL (ref 3.8–10.8)

## 2019-10-14 LAB — COMPLETE METABOLIC PANEL WITH GFR
AG Ratio: 1.4 (calc) (ref 1.0–2.5)
ALT: 13 U/L (ref 6–29)
AST: 19 U/L (ref 10–35)
Albumin: 3.8 g/dL (ref 3.6–5.1)
Alkaline phosphatase (APISO): 53 U/L (ref 31–125)
BUN: 16 mg/dL (ref 7–25)
CO2: 24 mmol/L (ref 20–32)
Calcium: 9.7 mg/dL (ref 8.6–10.2)
Chloride: 105 mmol/L (ref 98–110)
Creat: 0.96 mg/dL (ref 0.50–1.10)
GFR, Est African American: 81 mL/min/{1.73_m2} (ref >=60)
GFR, Est Non African American: 70 mL/min/{1.73_m2} (ref >=60)
Globulin: 2.8 g/dL (calc) (ref 1.9–3.7)
Glucose, Bld: 85 mg/dL (ref 65–99)
Potassium: 5.6 mmol/L — ABNORMAL HIGH (ref 3.5–5.3)
Sodium: 139 mmol/L (ref 135–146)
Total Bilirubin: 1.4 mg/dL — ABNORMAL HIGH (ref 0.2–1.2)
Total Protein: 6.6 g/dL (ref 6.1–8.1)

## 2019-10-14 LAB — URINE CULTURE
MICRO NUMBER:: 10061483
Result:: NO GROWTH
SPECIMEN QUALITY:: ADEQUATE

## 2019-10-14 LAB — LIPID PANEL
Cholesterol: 202 mg/dL — ABNORMAL HIGH (ref <200)
HDL: 80 mg/dL (ref >=50)
LDL Cholesterol (Calc): 106 mg/dL (calc) — ABNORMAL HIGH
Non-HDL Cholesterol (Calc): 122 mg/dL (calc) (ref <130)
Total CHOL/HDL Ratio: 2.5 (calc) (ref <5.0)
Triglycerides: 74 mg/dL (ref <150)

## 2019-10-14 LAB — TSH: TSH: 2.52 mIU/L

## 2019-10-14 LAB — VITAMIN D 25 HYDROXY (VIT D DEFICIENCY, FRACTURES): Vit D, 25-Hydroxy: 31 ng/mL (ref 30–100)

## 2019-10-14 LAB — SAR COV2 SEROLOGY (COVID19)AB(IGG),IA: SARS CoV2 AB IGG: NEGATIVE

## 2019-10-15 ENCOUNTER — Other Ambulatory Visit: Payer: Self-pay

## 2019-10-15 ENCOUNTER — Other Ambulatory Visit: Payer: BC Managed Care – PPO | Admitting: Internal Medicine

## 2019-10-15 DIAGNOSIS — E875 Hyperkalemia: Secondary | ICD-10-CM

## 2019-10-15 DIAGNOSIS — J3089 Other allergic rhinitis: Secondary | ICD-10-CM | POA: Diagnosis not present

## 2019-10-15 DIAGNOSIS — J301 Allergic rhinitis due to pollen: Secondary | ICD-10-CM | POA: Diagnosis not present

## 2019-10-15 LAB — POTASSIUM: Potassium: 4.2 mmol/L (ref 3.5–5.3)

## 2019-10-22 DIAGNOSIS — J301 Allergic rhinitis due to pollen: Secondary | ICD-10-CM | POA: Diagnosis not present

## 2019-10-22 DIAGNOSIS — J3089 Other allergic rhinitis: Secondary | ICD-10-CM | POA: Diagnosis not present

## 2019-10-24 NOTE — Patient Instructions (Signed)
It was a pleasure to see you today.  Take vitamin D supplement.  Please have bone density study.  Return in 1 year or as needed.

## 2019-10-27 DIAGNOSIS — J3089 Other allergic rhinitis: Secondary | ICD-10-CM | POA: Diagnosis not present

## 2019-10-27 DIAGNOSIS — J301 Allergic rhinitis due to pollen: Secondary | ICD-10-CM | POA: Diagnosis not present

## 2019-11-19 DIAGNOSIS — J3089 Other allergic rhinitis: Secondary | ICD-10-CM | POA: Diagnosis not present

## 2019-11-19 DIAGNOSIS — J301 Allergic rhinitis due to pollen: Secondary | ICD-10-CM | POA: Diagnosis not present

## 2019-11-26 ENCOUNTER — Ambulatory Visit: Payer: BC Managed Care – PPO | Attending: Internal Medicine

## 2019-11-26 DIAGNOSIS — Z23 Encounter for immunization: Secondary | ICD-10-CM

## 2019-11-26 NOTE — Progress Notes (Signed)
   Covid-19 Vaccination Clinic  Name:  Tammy Hendrix    MRN: [id number] DOB: 08/02/1971  11/26/2019  Ms. Tammy Hendrix was observed post Covid-19 immunization for 15 minutes without incident. She was provided with Vaccine Information Sheet and instruction to access the V-Safe system.   Ms. Tammy Hendrix was instructed to call 911 with any severe reactions post vaccine: Marland Kitchen Difficulty breathing  . Swelling of face and throat  . A fast heartbeat  . A bad rash all over body  . Dizziness and weakness

## 2019-12-01 DIAGNOSIS — J301 Allergic rhinitis due to pollen: Secondary | ICD-10-CM | POA: Diagnosis not present

## 2019-12-01 DIAGNOSIS — J3089 Other allergic rhinitis: Secondary | ICD-10-CM | POA: Diagnosis not present

## 2019-12-10 DIAGNOSIS — J301 Allergic rhinitis due to pollen: Secondary | ICD-10-CM | POA: Diagnosis not present

## 2019-12-10 DIAGNOSIS — J3089 Other allergic rhinitis: Secondary | ICD-10-CM | POA: Diagnosis not present

## 2019-12-17 DIAGNOSIS — J3089 Other allergic rhinitis: Secondary | ICD-10-CM | POA: Diagnosis not present

## 2019-12-17 DIAGNOSIS — J301 Allergic rhinitis due to pollen: Secondary | ICD-10-CM | POA: Diagnosis not present

## 2019-12-22 ENCOUNTER — Ambulatory Visit: Payer: BC Managed Care – PPO | Attending: Internal Medicine

## 2019-12-22 DIAGNOSIS — Z23 Encounter for immunization: Secondary | ICD-10-CM

## 2019-12-22 NOTE — Progress Notes (Signed)
   Covid-19 Vaccination Clinic  Name:  Tammy Hendrix    MRN: [id number] DOB: [birth year]  12/22/2019  [name] was observed post Covid-19 immunization for 15 minutes without incident. She was provided with Vaccine Information Sheet and instruction to access the V-Safe system.   [name] was instructed to call 911 with any severe reactions post vaccine: Marland Kitchen Difficulty breathing  . Swelling of face and throat  . A fast heartbeat  . A bad rash all over body  . Dizziness and weakness   Immunizations Administered    Name Date Dose VIS Date Route   Pfizer COVID-19 Vaccine 12/22/2019 12:20 PM 0.3 mL 09/03/2019 Intramuscular   Manufacturer: [place]   Lot: [id number]   [place]: [id number]

## 2020-01-13 ENCOUNTER — Other Ambulatory Visit: Payer: BC Managed Care – PPO

## 2020-01-13 DIAGNOSIS — J301 Allergic rhinitis due to pollen: Secondary | ICD-10-CM | POA: Diagnosis not present

## 2020-01-13 DIAGNOSIS — J3089 Other allergic rhinitis: Secondary | ICD-10-CM | POA: Diagnosis not present

## 2020-01-20 DIAGNOSIS — J301 Allergic rhinitis due to pollen: Secondary | ICD-10-CM | POA: Diagnosis not present

## 2020-01-20 DIAGNOSIS — J3089 Other allergic rhinitis: Secondary | ICD-10-CM | POA: Diagnosis not present

## 2020-01-26 DIAGNOSIS — J3089 Other allergic rhinitis: Secondary | ICD-10-CM | POA: Diagnosis not present

## 2020-01-26 DIAGNOSIS — J301 Allergic rhinitis due to pollen: Secondary | ICD-10-CM | POA: Diagnosis not present

## 2020-02-07 ENCOUNTER — Ambulatory Visit
Admission: RE | Admit: 2020-02-07 | Discharge: 2020-02-07 | Disposition: A | Discharge status: Home / Self Care | Payer: BC Managed Care – PPO | Source: Ambulatory Visit | Attending: Internal Medicine | Admitting: Internal Medicine

## 2020-02-07 ENCOUNTER — Other Ambulatory Visit: Payer: Self-pay

## 2020-02-07 DIAGNOSIS — M8589 Other specified disorders of bone density and structure, multiple sites: Secondary | ICD-10-CM | POA: Diagnosis not present

## 2020-02-15 DIAGNOSIS — J301 Allergic rhinitis due to pollen: Secondary | ICD-10-CM | POA: Diagnosis not present

## 2020-02-16 DIAGNOSIS — J3089 Other allergic rhinitis: Secondary | ICD-10-CM | POA: Diagnosis not present

## 2020-02-16 DIAGNOSIS — J301 Allergic rhinitis due to pollen: Secondary | ICD-10-CM | POA: Diagnosis not present

## 2020-02-23 DIAGNOSIS — J3089 Other allergic rhinitis: Secondary | ICD-10-CM | POA: Diagnosis not present

## 2020-02-23 DIAGNOSIS — J301 Allergic rhinitis due to pollen: Secondary | ICD-10-CM | POA: Diagnosis not present

## 2020-02-29 DIAGNOSIS — J3089 Other allergic rhinitis: Secondary | ICD-10-CM | POA: Diagnosis not present

## 2020-02-29 DIAGNOSIS — J301 Allergic rhinitis due to pollen: Secondary | ICD-10-CM | POA: Diagnosis not present

## 2020-03-03 DIAGNOSIS — J3089 Other allergic rhinitis: Secondary | ICD-10-CM | POA: Diagnosis not present

## 2020-03-03 DIAGNOSIS — J301 Allergic rhinitis due to pollen: Secondary | ICD-10-CM | POA: Diagnosis not present

## 2020-03-15 DIAGNOSIS — J3089 Other allergic rhinitis: Secondary | ICD-10-CM | POA: Diagnosis not present

## 2020-03-15 DIAGNOSIS — J301 Allergic rhinitis due to pollen: Secondary | ICD-10-CM | POA: Diagnosis not present

## 2020-03-23 DIAGNOSIS — J301 Allergic rhinitis due to pollen: Secondary | ICD-10-CM | POA: Diagnosis not present

## 2020-03-23 DIAGNOSIS — J3089 Other allergic rhinitis: Secondary | ICD-10-CM | POA: Diagnosis not present

## 2020-03-29 DIAGNOSIS — J3089 Other allergic rhinitis: Secondary | ICD-10-CM | POA: Diagnosis not present

## 2020-03-29 DIAGNOSIS — J301 Allergic rhinitis due to pollen: Secondary | ICD-10-CM | POA: Diagnosis not present

## 2020-04-19 DIAGNOSIS — J3089 Other allergic rhinitis: Secondary | ICD-10-CM | POA: Diagnosis not present

## 2020-04-19 DIAGNOSIS — J301 Allergic rhinitis due to pollen: Secondary | ICD-10-CM | POA: Diagnosis not present

## 2020-05-03 DIAGNOSIS — J3089 Other allergic rhinitis: Secondary | ICD-10-CM | POA: Diagnosis not present

## 2020-05-03 DIAGNOSIS — J301 Allergic rhinitis due to pollen: Secondary | ICD-10-CM | POA: Diagnosis not present

## 2020-05-26 DIAGNOSIS — J301 Allergic rhinitis due to pollen: Secondary | ICD-10-CM | POA: Diagnosis not present

## 2020-05-26 DIAGNOSIS — J3089 Other allergic rhinitis: Secondary | ICD-10-CM | POA: Diagnosis not present

## 2020-05-31 DIAGNOSIS — J3089 Other allergic rhinitis: Secondary | ICD-10-CM | POA: Diagnosis not present

## 2020-05-31 DIAGNOSIS — J301 Allergic rhinitis due to pollen: Secondary | ICD-10-CM | POA: Diagnosis not present

## 2020-05-31 DIAGNOSIS — J3081 Allergic rhinitis due to animal (cat) (dog) hair and dander: Secondary | ICD-10-CM | POA: Diagnosis not present

## 2020-06-01 DIAGNOSIS — J4520 Mild intermittent asthma, uncomplicated: Secondary | ICD-10-CM | POA: Diagnosis not present

## 2020-06-01 DIAGNOSIS — J301 Allergic rhinitis due to pollen: Secondary | ICD-10-CM | POA: Diagnosis not present

## 2020-06-01 DIAGNOSIS — J3089 Other allergic rhinitis: Secondary | ICD-10-CM | POA: Diagnosis not present

## 2020-06-01 DIAGNOSIS — R05 Cough: Secondary | ICD-10-CM | POA: Diagnosis not present

## 2020-06-08 DIAGNOSIS — J301 Allergic rhinitis due to pollen: Secondary | ICD-10-CM | POA: Diagnosis not present

## 2020-06-08 DIAGNOSIS — J3089 Other allergic rhinitis: Secondary | ICD-10-CM | POA: Diagnosis not present

## 2020-06-14 DIAGNOSIS — J301 Allergic rhinitis due to pollen: Secondary | ICD-10-CM | POA: Diagnosis not present

## 2020-06-14 DIAGNOSIS — J3089 Other allergic rhinitis: Secondary | ICD-10-CM | POA: Diagnosis not present

## 2020-06-21 DIAGNOSIS — J301 Allergic rhinitis due to pollen: Secondary | ICD-10-CM | POA: Diagnosis not present

## 2020-06-21 DIAGNOSIS — J3089 Other allergic rhinitis: Secondary | ICD-10-CM | POA: Diagnosis not present

## 2020-06-28 DIAGNOSIS — J3089 Other allergic rhinitis: Secondary | ICD-10-CM | POA: Diagnosis not present

## 2020-06-28 DIAGNOSIS — J301 Allergic rhinitis due to pollen: Secondary | ICD-10-CM | POA: Diagnosis not present

## 2020-07-12 DIAGNOSIS — J3089 Other allergic rhinitis: Secondary | ICD-10-CM | POA: Diagnosis not present

## 2020-07-12 DIAGNOSIS — J301 Allergic rhinitis due to pollen: Secondary | ICD-10-CM | POA: Diagnosis not present

## 2020-07-19 DIAGNOSIS — Z1231 Encounter for screening mammogram for malignant neoplasm of breast: Secondary | ICD-10-CM | POA: Diagnosis not present

## 2020-07-19 DIAGNOSIS — Z6822 Body mass index (BMI) 22.0-22.9, adult: Secondary | ICD-10-CM | POA: Diagnosis not present

## 2020-07-19 DIAGNOSIS — Z01419 Encounter for gynecological examination (general) (routine) without abnormal findings: Secondary | ICD-10-CM | POA: Diagnosis not present

## 2020-08-09 DIAGNOSIS — J3089 Other allergic rhinitis: Secondary | ICD-10-CM | POA: Diagnosis not present

## 2020-08-09 DIAGNOSIS — J301 Allergic rhinitis due to pollen: Secondary | ICD-10-CM | POA: Diagnosis not present

## 2020-09-07 DIAGNOSIS — J3089 Other allergic rhinitis: Secondary | ICD-10-CM | POA: Diagnosis not present

## 2020-09-07 DIAGNOSIS — J301 Allergic rhinitis due to pollen: Secondary | ICD-10-CM | POA: Diagnosis not present

## 2020-09-29 DIAGNOSIS — J3089 Other allergic rhinitis: Secondary | ICD-10-CM | POA: Diagnosis not present

## 2020-09-29 DIAGNOSIS — J301 Allergic rhinitis due to pollen: Secondary | ICD-10-CM | POA: Diagnosis not present

## 2020-10-05 DIAGNOSIS — J301 Allergic rhinitis due to pollen: Secondary | ICD-10-CM | POA: Diagnosis not present

## 2020-10-05 DIAGNOSIS — J3089 Other allergic rhinitis: Secondary | ICD-10-CM | POA: Diagnosis not present

## 2020-10-12 DIAGNOSIS — J301 Allergic rhinitis due to pollen: Secondary | ICD-10-CM | POA: Diagnosis not present

## 2020-10-12 DIAGNOSIS — J3089 Other allergic rhinitis: Secondary | ICD-10-CM | POA: Diagnosis not present

## 2020-10-12 DIAGNOSIS — J3081 Allergic rhinitis due to animal (cat) (dog) hair and dander: Secondary | ICD-10-CM | POA: Diagnosis not present

## 2020-10-20 DIAGNOSIS — J3089 Other allergic rhinitis: Secondary | ICD-10-CM | POA: Diagnosis not present

## 2020-10-20 DIAGNOSIS — J301 Allergic rhinitis due to pollen: Secondary | ICD-10-CM | POA: Diagnosis not present

## 2020-10-26 DIAGNOSIS — J3089 Other allergic rhinitis: Secondary | ICD-10-CM | POA: Diagnosis not present

## 2020-10-26 DIAGNOSIS — J301 Allergic rhinitis due to pollen: Secondary | ICD-10-CM | POA: Diagnosis not present

## 2020-11-03 DIAGNOSIS — J3089 Other allergic rhinitis: Secondary | ICD-10-CM | POA: Diagnosis not present

## 2020-11-03 DIAGNOSIS — J301 Allergic rhinitis due to pollen: Secondary | ICD-10-CM | POA: Diagnosis not present

## 2020-11-17 DIAGNOSIS — J3089 Other allergic rhinitis: Secondary | ICD-10-CM | POA: Diagnosis not present

## 2020-11-17 DIAGNOSIS — J301 Allergic rhinitis due to pollen: Secondary | ICD-10-CM | POA: Diagnosis not present

## 2020-11-20 DIAGNOSIS — L638 Other alopecia areata: Secondary | ICD-10-CM | POA: Diagnosis not present

## 2020-11-24 DIAGNOSIS — J3089 Other allergic rhinitis: Secondary | ICD-10-CM | POA: Diagnosis not present

## 2020-11-24 DIAGNOSIS — J3081 Allergic rhinitis due to animal (cat) (dog) hair and dander: Secondary | ICD-10-CM | POA: Diagnosis not present

## 2020-11-24 DIAGNOSIS — J301 Allergic rhinitis due to pollen: Secondary | ICD-10-CM | POA: Diagnosis not present

## 2020-12-06 DIAGNOSIS — H11421 Conjunctival edema, right eye: Secondary | ICD-10-CM | POA: Diagnosis not present

## 2020-12-15 DIAGNOSIS — J3089 Other allergic rhinitis: Secondary | ICD-10-CM | POA: Diagnosis not present

## 2020-12-15 DIAGNOSIS — J301 Allergic rhinitis due to pollen: Secondary | ICD-10-CM | POA: Diagnosis not present

## 2020-12-26 DIAGNOSIS — J301 Allergic rhinitis due to pollen: Secondary | ICD-10-CM | POA: Diagnosis not present

## 2020-12-26 DIAGNOSIS — J3089 Other allergic rhinitis: Secondary | ICD-10-CM | POA: Diagnosis not present

## 2021-01-11 ENCOUNTER — Telehealth: Payer: Self-pay | Admitting: Internal Medicine

## 2021-01-11 ENCOUNTER — Other Ambulatory Visit: Payer: BC Managed Care – PPO | Admitting: Internal Medicine

## 2021-01-11 NOTE — Telephone Encounter (Signed)
Pt called and said her dermatologist requested that when she come in for labs on Monday if we could check her thyroid as well because there was a concern of alopecia

## 2021-01-11 NOTE — Telephone Encounter (Signed)
Add Fe/TIBC and free T4 as well as TSH for alopecia

## 2021-01-15 ENCOUNTER — Other Ambulatory Visit: Payer: Self-pay

## 2021-01-15 ENCOUNTER — Other Ambulatory Visit: Payer: BC Managed Care – PPO | Admitting: Internal Medicine

## 2021-01-15 DIAGNOSIS — I8390 Asymptomatic varicose veins of unspecified lower extremity: Secondary | ICD-10-CM | POA: Diagnosis not present

## 2021-01-15 DIAGNOSIS — L659 Nonscarring hair loss, unspecified: Secondary | ICD-10-CM

## 2021-01-15 DIAGNOSIS — Z1321 Encounter for screening for nutritional disorder: Secondary | ICD-10-CM

## 2021-01-15 DIAGNOSIS — M8580 Other specified disorders of bone density and structure, unspecified site: Secondary | ICD-10-CM

## 2021-01-15 DIAGNOSIS — Z0000 Encounter for general adult medical examination without abnormal findings: Secondary | ICD-10-CM

## 2021-01-15 DIAGNOSIS — D7589 Other specified diseases of blood and blood-forming organs: Secondary | ICD-10-CM | POA: Diagnosis not present

## 2021-01-16 ENCOUNTER — Other Ambulatory Visit: Payer: Self-pay

## 2021-01-16 DIAGNOSIS — J3089 Other allergic rhinitis: Secondary | ICD-10-CM | POA: Diagnosis not present

## 2021-01-16 DIAGNOSIS — J301 Allergic rhinitis due to pollen: Secondary | ICD-10-CM | POA: Diagnosis not present

## 2021-01-16 DIAGNOSIS — D7589 Other specified diseases of blood and blood-forming organs: Secondary | ICD-10-CM

## 2021-01-17 LAB — COMPLETE METABOLIC PANEL WITH GFR
AG Ratio: 1.3 (calc) (ref 1.0–2.5)
ALT: 12 U/L (ref 6–29)
AST: 18 U/L (ref 10–35)
Albumin: 4 g/dL (ref 3.6–5.1)
Alkaline phosphatase (APISO): 51 U/L (ref 37–153)
BUN: 13 mg/dL (ref 7–25)
CO2: 26 mmol/L (ref 20–32)
Calcium: 9.2 mg/dL (ref 8.6–10.4)
Chloride: 102 mmol/L (ref 98–110)
Creat: 0.88 mg/dL (ref 0.50–1.05)
GFR, Est African American: 89 mL/min/{1.73_m2} (ref >=60)
GFR, Est Non African American: 77 mL/min/{1.73_m2} (ref >=60)
Globulin: 3 g/dL (calc) (ref 1.9–3.7)
Glucose, Bld: 81 mg/dL (ref 65–99)
Potassium: 4.4 mmol/L (ref 3.5–5.3)
Sodium: 136 mmol/L (ref 135–146)
Total Bilirubin: 1.3 mg/dL — ABNORMAL HIGH (ref 0.2–1.2)
Total Protein: 7 g/dL (ref 6.1–8.1)

## 2021-01-17 LAB — CBC WITH DIFFERENTIAL/PLATELET
Absolute Monocytes: 410 cells/uL (ref 200–950)
Basophils Absolute: 49 cells/uL (ref 0–200)
Basophils Relative: 0.6 %
Eosinophils Absolute: 221 cells/uL (ref 15–500)
Eosinophils Relative: 2.7 %
HCT: 42.2 % (ref 35.0–45.0)
Hemoglobin: 14.3 g/dL (ref 11.7–15.5)
Lymphs Abs: 2403 cells/uL (ref 850–3900)
MCH: 34.4 pg — ABNORMAL HIGH (ref 27.0–33.0)
MCHC: 33.9 g/dL (ref 32.0–36.0)
MCV: 101.4 fL — ABNORMAL HIGH (ref 80.0–100.0)
MPV: 10.7 fL (ref 7.5–12.5)
Monocytes Relative: 5 %
Neutro Abs: 5117 cells/uL (ref 1500–7800)
Neutrophils Relative %: 62.4 %
Platelets: 311 10*3/uL (ref 140–400)
RBC: 4.16 10*6/uL (ref 3.80–5.10)
RDW: 11.9 % (ref 11.0–15.0)
Total Lymphocyte: 29.3 %
WBC: 8.2 10*3/uL (ref 3.8–10.8)

## 2021-01-17 LAB — LIPID PANEL
Cholesterol: 202 mg/dL — ABNORMAL HIGH (ref <200)
HDL: 73 mg/dL (ref >=50)
LDL Cholesterol (Calc): 108 mg/dL (calc) — ABNORMAL HIGH
Non-HDL Cholesterol (Calc): 129 mg/dL (calc) (ref <130)
Total CHOL/HDL Ratio: 2.8 (calc) (ref <5.0)
Triglycerides: 112 mg/dL (ref <150)

## 2021-01-17 LAB — IRON,TIBC AND FERRITIN PANEL
%SAT: 66 % (calc) — ABNORMAL HIGH (ref 16–45)
Ferritin: 41 ng/mL (ref 16–232)
Iron: 255 ug/dL — ABNORMAL HIGH (ref 45–160)
TIBC: 388 mcg/dL (calc) (ref 250–450)

## 2021-01-17 LAB — TSH: TSH: 3.79 mIU/L

## 2021-01-17 LAB — TEST AUTHORIZATION

## 2021-01-17 LAB — VITAMIN B12: Vitamin B-12: 372 pg/mL (ref 200–1100)

## 2021-01-17 LAB — VITAMIN D 25 HYDROXY (VIT D DEFICIENCY, FRACTURES): Vit D, 25-Hydroxy: 44 ng/mL (ref 30–100)

## 2021-01-17 LAB — T4, FREE: Free T4: 1.1 ng/dL (ref 0.8–1.8)

## 2021-01-18 ENCOUNTER — Ambulatory Visit (INDEPENDENT_AMBULATORY_CARE_PROVIDER_SITE_OTHER): Payer: BC Managed Care – PPO | Admitting: Internal Medicine

## 2021-01-18 ENCOUNTER — Other Ambulatory Visit: Payer: Self-pay

## 2021-01-18 ENCOUNTER — Encounter: Payer: Self-pay | Admitting: Internal Medicine

## 2021-01-18 VITALS — BP 110/70 | HR 66 | Ht 62.0 in | Wt 122.0 lb

## 2021-01-18 DIAGNOSIS — J4520 Mild intermittent asthma, uncomplicated: Secondary | ICD-10-CM | POA: Diagnosis not present

## 2021-01-18 DIAGNOSIS — Z23 Encounter for immunization: Secondary | ICD-10-CM

## 2021-01-18 DIAGNOSIS — Z0000 Encounter for general adult medical examination without abnormal findings: Secondary | ICD-10-CM

## 2021-01-18 DIAGNOSIS — I8390 Asymptomatic varicose veins of unspecified lower extremity: Secondary | ICD-10-CM | POA: Diagnosis not present

## 2021-01-18 DIAGNOSIS — Z8262 Family history of osteoporosis: Secondary | ICD-10-CM

## 2021-01-18 DIAGNOSIS — L659 Nonscarring hair loss, unspecified: Secondary | ICD-10-CM

## 2021-01-18 DIAGNOSIS — J3089 Other allergic rhinitis: Secondary | ICD-10-CM

## 2021-01-18 DIAGNOSIS — L309 Dermatitis, unspecified: Secondary | ICD-10-CM

## 2021-01-18 DIAGNOSIS — J305 Allergic rhinitis due to food: Secondary | ICD-10-CM

## 2021-01-18 DIAGNOSIS — J3081 Allergic rhinitis due to animal (cat) (dog) hair and dander: Secondary | ICD-10-CM | POA: Diagnosis not present

## 2021-01-18 LAB — POCT URINALYSIS DIPSTICK
Appearance: NEGATIVE
Bilirubin, UA: NEGATIVE
Blood, UA: NEGATIVE
Glucose, UA: NEGATIVE
Ketones, UA: NEGATIVE
Leukocytes, UA: NEGATIVE
Nitrite, UA: NEGATIVE
Odor: NEGATIVE
Protein, UA: NEGATIVE
Spec Grav, UA: 1.015 (ref 1.010–1.025)
Urobilinogen, UA: 0.2 E.U./dL
pH, UA: 6.5 (ref 5.0–8.0)

## 2021-01-18 NOTE — Progress Notes (Signed)
Subjective:    Patient ID: Tammy Hendrix, female    DOB: Nov 26, 1970, 50 y.o.   MRN: [id number]  HPI 50 year old Female seen for health maintenance exam and evaluation of medical issues.  Patient says her mother had Covid-19 in [place] where she resides and was quite ill but has recovered. Patient has had some hair loss over the past few months that may be related to stress with this event.  Patient has been taking some vitamins to help with hair loss and has tried NOOM for weight loss.  She would like to see Dr. Jones Skene and [facility] regarding her leg veins. We are told that she does not need a referral by receptionist there and can make her own appointment.  She is due for screening colonoscopy and referral will be made to [facility].  She will be traveling to [place] in June. Advised getting Covid booster at least 2 weeks prior to departure. She has had 3 Covid-19 vaccines.  She has a history of allergic rhinitis and sees Dr. Fredderick Phenix.  Her labs are reviewed.  Her hemoglobin, white blood cell count and platelet count are normal.  Her MCV is slightly elevated at 101.4.  We checked vitamin B12 which was low normal at 372.  Her iron level was elevated at 55, iron binding capacity normal, percent saturation high at 66%.  Ferritin was normal at 41.  Free T4 and TSH normal.  Vitamin D level normal.  Fasting total bilirubin slightly elevated at 1.3.  We have seen this in the past and is likely Gilbert's phenomenon.  Total cholesterol is near normal at 202.  HDL is excellent at 73.  Triglycerides normal at 112 and LDL barely elevated at 108 normal being less than 100.  Social history: She is a Agricultural engineer.  Married.  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  Has a Masters degree in religious education.  She does not smoke.  Social alcohol consumption.  She has 3 children, 2 daughters and a son.  Family history: Her father is a retired Engineer, drilling.  He has a history of hyperlipidemia,  neuropathy, genetic osteoporosis, esophagitis and gastritis.  Mother with history of asthma and allergic rhinitis and has recovered from recent COVID-19 infection.  Mother also has hypertension.  2 brothers and 2 sisters in good health.  Patient has no known drug allergies.  No history of hospitalizations other than childbirth.  No history of operations.  She has eczema in addition to asthma.  She had a bone density study in May 2021 showing mild osteopenia in the AP spine with Z score of -1.8.  Suggest repeat study in 2023.  She is on Intrarosa vaginal insertions daily per GYN.  She uses albuterol inhaler, takes antihistamine and uses a steroid nasal spray for allergic rhinitis.  Allergy testing done in 2010 was positive for dust mite, trees, grasses, shellfish, peanuts, dogs, cats and feathers.      Review of Systems  Constitutional: Negative.   HENT: Negative.   Respiratory:       History of asthma and wheezes occasionally  Cardiovascular: Negative.   Gastrointestinal: Negative.   Endocrine:       Alopecia areata  Genitourinary:       See GYN  Musculoskeletal: Negative.   Neurological: Negative.   Psychiatric/Behavioral: Negative.        Objective:   Physical Exam BP 110/70 pulse 66, pulse ox 97% weight 122 pounds. BMI 22.31 Skin is dry with some  mild eczema noted on her arms.  Nodes none.  TMs are clear.  Neck is supple without thyromegaly or adenopathy.  No carotid bruits.  Chest clear to auscultation.  Breasts: Fibrocystic changes noted in the left medial breast.  Cardiac exam: Regular rate and rhythm normal S1 and S2 without murmurs.  Abdomen: Soft nondistended without hepatosplenomegaly masses or tenderness.  Superficial varicosities lower extremities.  Neuro: Intact without focal deficits.  Affect thought and judgment are normal.  She has small area of mild alopecia areata approximately 3 cm in diameter.       Assessment & Plan:  I am not sure why her iron level is  slightly elevated at 55.  She is not taking an iron supplement.  I looked at her vitamin bottles today that she is taking regarding her hair loss.    I think the alopecia is related to stress and hopefully will resolve.  Sometimes iron deficiency associated with alopecia but her level is slightly elevated.  Her B12 level is at the lower end of normal.  I suggest we recheck this along with her iron level in 6 months.  She is agreeable to this.  Allergic rhinitis-treated by Dr. Rolm Baptise has allergies to dust mite, trees, grasses, shellfish, peanuts, dogs, cats, feathers.  She takes Claritin, uses as needed albuterol inhaler and a steroid nasal spray.  History of eczema  Health maintenance-needs colonoscopy and mammogram.  Referral placed for colonoscopy.  Travel advice-going to [place] in June.  Recommend COVID booster.  Continue to monitor for osteopenia-Next bone density due 2023  Varicose veins lower extremities-superficial.  She will contact [facility].  Tetanus immunization update given today.  Perimenopause-is on  estrogen replacement per GYN  History of asthma-treated with Proventil inhaler  Family history of osteoporosis in her father-recommend bone density study in 2023.  Plan: Plan to see her again in 6 months and recheck B12 which is at the lower end of normal and recheck iron level which is slightly elevated.  She is agreeable to this plan.

## 2021-01-18 NOTE — Patient Instructions (Signed)
It was a pleasure to see you today.  Please return in 6 months for repeat B12 level and serum iron level.  Have COVID-vaccine at least 2 weeks prior to your trip to [place].  Tetanus immunization update given today.

## 2021-01-19 ENCOUNTER — Telehealth: Payer: Self-pay | Admitting: Internal Medicine

## 2021-01-19 NOTE — Telephone Encounter (Signed)
Called and let Tammy Hendrix know that I had called Dr Elza Rafter office, he is accepting new patients and does not need referral, he works with [facility]. She just needs to call and schedule appointment.

## 2021-02-03 ENCOUNTER — Telehealth: Payer: Self-pay | Admitting: Internal Medicine

## 2021-02-03 ENCOUNTER — Encounter: Payer: Self-pay | Admitting: Internal Medicine

## 2021-02-03 NOTE — Telephone Encounter (Signed)
Phone call from patient regarding positive Covid-19 home test. Has mild to moderate symptoms. Hx mild asthma. Is on antibiotics per allergist for sinusitis. Has an inhaler. Asked her to monitor pulse ox. Currently not SOB. May take Tylenol or Advil if has fever or myalgias. Stay well hydrated. Call me back if symptoms worsen.

## 2021-06-01 DIAGNOSIS — J4520 Mild intermittent asthma, uncomplicated: Secondary | ICD-10-CM | POA: Diagnosis not present

## 2021-06-01 DIAGNOSIS — J3089 Other allergic rhinitis: Secondary | ICD-10-CM | POA: Diagnosis not present

## 2021-06-01 DIAGNOSIS — J301 Allergic rhinitis due to pollen: Secondary | ICD-10-CM | POA: Diagnosis not present

## 2021-06-01 DIAGNOSIS — R059 Cough, unspecified: Secondary | ICD-10-CM | POA: Diagnosis not present

## 2021-07-23 ENCOUNTER — Other Ambulatory Visit: Payer: Self-pay

## 2021-07-23 ENCOUNTER — Other Ambulatory Visit: Payer: BC Managed Care – PPO | Admitting: Internal Medicine

## 2021-07-23 DIAGNOSIS — R6889 Other general symptoms and signs: Secondary | ICD-10-CM | POA: Diagnosis not present

## 2021-07-23 DIAGNOSIS — Z1321 Encounter for screening for nutritional disorder: Secondary | ICD-10-CM | POA: Diagnosis not present

## 2021-07-24 LAB — IRON,TIBC AND FERRITIN PANEL
%SAT: 36 % (calc) (ref 16–45)
Ferritin: 28 ng/mL (ref 16–232)
Iron: 129 ug/dL (ref 45–160)
TIBC: 358 mcg/dL (calc) (ref 250–450)

## 2021-07-24 LAB — VITAMIN B12: Vitamin B-12: 340 pg/mL (ref 200–1100)

## 2021-07-26 ENCOUNTER — Ambulatory Visit (INDEPENDENT_AMBULATORY_CARE_PROVIDER_SITE_OTHER): Payer: BC Managed Care – PPO | Admitting: Internal Medicine

## 2021-07-26 ENCOUNTER — Other Ambulatory Visit: Payer: Self-pay

## 2021-07-26 ENCOUNTER — Ambulatory Visit: Payer: BC Managed Care – PPO | Admitting: Internal Medicine

## 2021-07-26 ENCOUNTER — Encounter: Payer: Self-pay | Admitting: Internal Medicine

## 2021-07-26 VITALS — BP 108/78 | HR 78 | Temp 98.2°F | Ht 62.0 in | Wt 124.0 lb

## 2021-07-26 DIAGNOSIS — Z1321 Encounter for screening for nutritional disorder: Secondary | ICD-10-CM | POA: Diagnosis not present

## 2021-07-26 DIAGNOSIS — R790 Abnormal level of blood mineral: Secondary | ICD-10-CM | POA: Diagnosis not present

## 2021-07-26 NOTE — Progress Notes (Signed)
   Subjective:    Patient ID: Tammy Hendrix, female    DOB: 1971-07-23, 50 y.o.   MRN: [id number]  HPI Patient was here in April and total iron was elevated at 255 with TIBC of 388 and ferritin of 41. Had suggested repeat in  6 months and to decrease/ stop iron supplement. Iron level is now normal at 129. TIBC is now 358. B12 done at pt request then was 372 and is now 340. Will continue to monitor this.  Lives healthy lifestyle but is busy.   Review of Systems no new complaints. Low dose  estrogen replacement pill contains iron. Can continue to monitor iron level.     Objective:   Physical Exam  BP 108/78 pulse 78  weight 124 pounds        Assessment & Plan:   B12 level is normal  Normal serum iron level.  No change in estrogen replacement medication or other meds. RTC for CPE in 6 months.

## 2021-08-13 DIAGNOSIS — Z1231 Encounter for screening mammogram for malignant neoplasm of breast: Secondary | ICD-10-CM | POA: Diagnosis not present

## 2021-08-13 DIAGNOSIS — Z01419 Encounter for gynecological examination (general) (routine) without abnormal findings: Secondary | ICD-10-CM | POA: Diagnosis not present

## 2021-08-13 DIAGNOSIS — Z6822 Body mass index (BMI) 22.0-22.9, adult: Secondary | ICD-10-CM | POA: Diagnosis not present

## 2021-08-28 NOTE — Patient Instructions (Addendum)
B12 level is normal. Iron level has returned to normal. RTC in 6 months for CPE. It was a pleasure to see you today. No need to take additional iron supplements.

## 2021-10-12 ENCOUNTER — Encounter: Payer: Self-pay | Admitting: Internal Medicine

## 2021-10-12 DIAGNOSIS — H2513 Age-related nuclear cataract, bilateral: Secondary | ICD-10-CM | POA: Diagnosis not present

## 2021-10-12 DIAGNOSIS — H33332 Multiple defects of retina without detachment, left eye: Secondary | ICD-10-CM | POA: Diagnosis not present

## 2021-10-12 DIAGNOSIS — H35413 Lattice degeneration of retina, bilateral: Secondary | ICD-10-CM | POA: Diagnosis not present

## 2021-10-12 DIAGNOSIS — H43823 Vitreomacular adhesion, bilateral: Secondary | ICD-10-CM | POA: Diagnosis not present

## 2021-10-26 DIAGNOSIS — H59812 Chorioretinal scars after surgery for detachment, left eye: Secondary | ICD-10-CM | POA: Diagnosis not present

## 2021-12-04 DIAGNOSIS — L718 Other rosacea: Secondary | ICD-10-CM | POA: Diagnosis not present

## 2021-12-04 DIAGNOSIS — L2089 Other atopic dermatitis: Secondary | ICD-10-CM | POA: Diagnosis not present

## 2021-12-18 ENCOUNTER — Encounter: Payer: Self-pay | Admitting: Gastroenterology

## 2022-01-21 ENCOUNTER — Other Ambulatory Visit: Payer: BC Managed Care – PPO | Admitting: Internal Medicine

## 2022-01-24 ENCOUNTER — Encounter: Payer: BC Managed Care – PPO | Admitting: Internal Medicine

## 2022-02-08 ENCOUNTER — Ambulatory Visit (AMBULATORY_SURGERY_CENTER): Payer: BC Managed Care – PPO | Admitting: *Deleted

## 2022-02-08 VITALS — Ht 62.5 in | Wt 125.0 lb

## 2022-02-08 DIAGNOSIS — Z1211 Encounter for screening for malignant neoplasm of colon: Secondary | ICD-10-CM

## 2022-02-08 MED ORDER — NA SULFATE-K SULFATE-MG SULF 17.5-3.13-1.6 GM/177ML PO SOLN: 1.0000 | Freq: Once | ORAL | 0 refills | Status: AC

## 2022-02-08 NOTE — Progress Notes (Signed)
No egg or soy allergy known to patient - TESTED + FOR SOY BUT NO REACTION TO SOY AND EATS SOY SAUCE WITH NO ISSUES  No issues known to pt with past sedation with any surgeries or procedures Patient denies ever being told they had issues or difficulty with intubation  No FH of Malignant Hyperthermia Pt is not on diet pills Pt is not on  home 02  Pt is not on blood thinners  Pt denies issues with constipation  No A fib or A flutter    NO PA's for preps discussed with pt In PV today  Discussed with pt there will be an out-of-pocket cost for prep and that varies from $0 to 70 +  dollars - pt verbalized understanding  Pt instructed to use Singlecare.com or GoodRx for a price reduction on prep   PV completed over the phone. Pt verified name, DOB, address and insurance during PV today.  Pt mailed instruction packet with copy of consent form to read and not return, and instructions.  Pt encouraged to call with questions or issues.  If pt has My chart, procedure instructions sent via My Chart  Insurance confirmed with pt at [facility] today

## 2022-03-06 ENCOUNTER — Encounter: Payer: Self-pay | Admitting: Gastroenterology

## 2022-03-06 DIAGNOSIS — R052 Subacute cough: Secondary | ICD-10-CM | POA: Diagnosis not present

## 2022-03-06 DIAGNOSIS — J301 Allergic rhinitis due to pollen: Secondary | ICD-10-CM | POA: Diagnosis not present

## 2022-03-06 DIAGNOSIS — J3089 Other allergic rhinitis: Secondary | ICD-10-CM | POA: Diagnosis not present

## 2022-03-06 DIAGNOSIS — J4520 Mild intermittent asthma, uncomplicated: Secondary | ICD-10-CM | POA: Diagnosis not present

## 2022-03-08 ENCOUNTER — Encounter: Payer: Self-pay | Admitting: Gastroenterology

## 2022-03-08 ENCOUNTER — Ambulatory Visit (AMBULATORY_SURGERY_CENTER): Payer: BC Managed Care – PPO | Admitting: Gastroenterology

## 2022-03-08 VITALS — BP 116/75 | HR 63 | Temp 97.7°F | Resp 12 | Ht 62.0 in | Wt 125.0 lb

## 2022-03-08 DIAGNOSIS — Z1211 Encounter for screening for malignant neoplasm of colon: Secondary | ICD-10-CM

## 2022-03-08 DIAGNOSIS — K635 Polyp of colon: Secondary | ICD-10-CM

## 2022-03-08 DIAGNOSIS — D120 Benign neoplasm of cecum: Secondary | ICD-10-CM

## 2022-03-08 MED ORDER — SODIUM CHLORIDE 0.9 % IV SOLN: 500.0000 mL | Freq: Once | INTRAVENOUS | Status: DC

## 2022-03-08 NOTE — Patient Instructions (Addendum)
Handouts were given to your care partner on polyps and hemorrhoids.    You may resume your current medications today. Await biopsy results.  May take 1-3 weeks to receive pathology results. Please call if any questions or concerns.    YOU HAD AN ENDOSCOPIC PROCEDURE TODAY AT [facility]:   Refer to the procedure report that was given to you for any specific questions about what was found during the examination.  If the procedure report does not answer your questions, please call your gastroenterologist to clarify.  If you requested that your care partner not be given the details of your procedure findings, then the procedure report has been included in a sealed envelope for you to review at your convenience later.  YOU SHOULD EXPECT: Some feelings of bloating in the abdomen. Passage of more gas than usual.  Walking can help get rid of the air that was put into your GI tract during the procedure and reduce the bloating. If you had a lower endoscopy (such as a colonoscopy or flexible sigmoidoscopy) you may notice spotting of blood in your stool or on the toilet paper. If you underwent a bowel prep for your procedure, you may not have a normal bowel movement for a few days.  Please Note:  You might notice some irritation and congestion in your nose or some drainage.  This is from the oxygen used during your procedure.  There is no need for concern and it should clear up in a day or so.  SYMPTOMS TO REPORT IMMEDIATELY:  Following lower endoscopy (colonoscopy or flexible sigmoidoscopy):  Excessive amounts of blood in the stool  Significant tenderness or worsening of abdominal pains  Swelling of the abdomen that is new, acute  Fever of 100F or higher   For urgent or emergent issues, a gastroenterologist can be reached at any hour by calling [phone]. Do not use MyChart messaging for urgent concerns.    DIET:  We do recommend a small meal at first, but then you may proceed  to your regular diet.  Drink plenty of fluids but you should avoid alcoholic beverages for 24 hours.  ACTIVITY:  You should plan to take it easy for the rest of today and you should NOT DRIVE or use heavy machinery until tomorrow (because of the sedation medicines used during the test).    FOLLOW UP: Our staff will call the number listed on your records 24-72 hours following your procedure to check on you and address any questions or concerns that you may have regarding the information given to you following your procedure. If we do not reach you, we will leave a message.  We will attempt to reach you two times.  During this call, we will ask if you have developed any symptoms of COVID 19. If you develop any symptoms (ie: fever, flu-like symptoms, shortness of breath, cough etc.) before then, please call [phone].  If you test positive for Covid 19 in the 2 weeks post procedure, please call and report this information to [place].    If any biopsies were taken you will be contacted by phone or by letter within the next 1-3 weeks.  Please call us at [phone] if you have not heard about the biopsies in 3 weeks.    SIGNATURES/CONFIDENTIALITY: You and/or your care partner have signed paperwork which will be entered into your electronic medical record.  These signatures attest to the fact that that the information above on your After  Visit Summary has been reviewed and is understood.  Full responsibility of the confidentiality of this discharge information lies with you and/or your care-partner.

## 2022-03-08 NOTE — Op Note (Signed)
[facility] Patient Name: Tammy Hendrix Procedure Date: 03/08/2022 1:29 PM MRN: [id number] Endoscopist: Mauri Pole , MD Age: 51 Referring MD:  Date of Birth: [birth year] Gender: Female Account #: [id number] Procedure:                Colonoscopy Indications:              Screening for colorectal malignant neoplasm Medicines:                Monitored Anesthesia Care Procedure:                Pre-Anesthesia Assessment:                           - Prior to the procedure, a History and Physical                            was performed, and patient medications and                            allergies were reviewed. The patient's tolerance of                            previous anesthesia was also reviewed. The risks                            and benefits of the procedure and the sedation                            options and risks were discussed with the patient.                            All questions were answered, and informed consent                            was obtained. Prior Anticoagulants: The patient has                            taken no previous anticoagulant or antiplatelet                            agents. ASA Grade Assessment: II - A patient with                            mild systemic disease. After reviewing the risks                            and benefits, the patient was deemed in                            satisfactory condition to undergo the procedure.                           After obtaining informed consent, the colonoscope  was passed under direct vision. Throughout the                            procedure, the patient's blood pressure, pulse, and                            oxygen saturations were monitored continuously. The                            Olympus PCF-[id number] (#[id number]) Colonoscope was                            introduced through the anus and advanced to the the                            cecum,  identified by appendiceal orifice and                            ileocecal valve. The colonoscopy was performed                            without difficulty. The patient tolerated the                            procedure well. The quality of the bowel                            preparation was good. The ileocecal valve,                            appendiceal orifice, and rectum were photographed. Scope In: 1:37:32 PM Scope Out: 1:52:36 PM Scope Withdrawal Time: 0 hours 8 minutes 55 seconds  Total Procedure Duration: 0 hours 15 minutes 4 seconds  Findings:                 The perianal and digital rectal examinations were                            normal.                           Two sessile polyps were found in the ascending                            colon and cecum. The polyps were 5 to 8 mm in size.                            These polyps were removed with a cold snare.                            Resection and retrieval were complete.                           Non-bleeding external and internal hemorrhoids were  found during retroflexion. The hemorrhoids were                            small.                           The exam was otherwise without abnormality. Complications:            No immediate complications. Estimated Blood Loss:     Estimated blood loss was minimal. Impression:               - Two 5 to 8 mm polyps in the ascending colon and                            in the cecum, removed with a cold snare. Resected                            and retrieved.                           - Non-bleeding external and internal hemorrhoids.                           - The examination was otherwise normal. Recommendation:           - Patient has a contact number available for                            emergencies. The signs and symptoms of potential                            delayed complications were discussed with the                            patient.  Return to normal activities tomorrow.                            Written discharge instructions were provided to the                            patient.                           - Resume previous diet.                           - Continue present medications.                           - Await pathology results.                           - Repeat colonoscopy in 5-10 years for surveillance                            based on pathology results. Mauri Pole, MD 03/08/2022 1:59:53 PM This report has been signed electronically.

## 2022-03-08 NOTE — Progress Notes (Signed)
Called to room to assist during endoscopic procedure.  Patient ID and intended procedure confirmed with present staff. Received instructions for my participation in the procedure from the performing physician.  

## 2022-03-08 NOTE — Progress Notes (Signed)
No problems noted in the recovery room. maw 

## 2022-03-08 NOTE — Progress Notes (Signed)
[facility] History and Physical   Primary Care Physician:  Elby Showers, MD   Reason for Procedure:  Colorectal cancer screening  Plan:    Screening colonoscopy with possible interventions as needed     HPI: Tammy Hendrix is a very pleasant 51 y.o. female here for screening colonoscopy. Denies any nausea, vomiting, abdominal pain, melena or bright red blood per rectum  The risks and benefits as well as alternatives of endoscopic procedure(s) have been discussed and reviewed. All questions answered. The patient agrees to proceed.    Past Medical History:  Diagnosis Date   Allergy    SEASONAL   Anemia    AFTER 3RD BABY   Asthma    Dermatitis    Heart murmur    TOLD AS A CHILD HAD MURMUR BUT NO ONE SAID SINCE    Past Surgical History:  Procedure Laterality Date   VAGINAL DELIVERY  2001, 2004, 2007   WISDOM TOOTH EXTRACTION     AGE 27 OR 20    Prior to Admission medications   Medication Sig Start Date End Date Taking? Authorizing Provider  cetirizine (ZYRTEC) 10 MG tablet Take 10 mg by mouth daily.   Yes [provider]  clindamycin (CLEOCIN T) 1 % lotion Apply 1 as directed to affected area once a day 12/04/21  Yes [provider]  Cyanocobalamin (VITAMIN B12 PO) Vitamin B12 500 mcg tablet   1000 micrograms every day by oral route. 08/07/21  Yes [provider]  desonide (DESOWEN) 0.05 % cream SMARTSIG:sparingly Topical Twice Daily 12/04/21  Yes [provider]  Norethin-Eth Estradiol-Fe [facility] FE,WYMZYA Orrin Brigham) 0.4-35 MG-MCG tablet Chew 1 tablet by mouth daily.   Yes [provider]  Olopatadine HCl (PATADAY) 0.2 % SOLN 1 drop into affected eye   Yes [provider]  Tacrolimus 0.1 % CREA See admin instructions.   Yes [provider]  albuterol (PROVENTIL HFA;VENTOLIN HFA) 108 (90 BASE) MCG/ACT inhaler Inhale 2 puffs into the lungs every 6 (six) hours as needed. 01/01/12    Moreno-Coll, Adlih, MD  budesonide-formoterol (SYMBICORT) 80-4.5 MCG/ACT inhaler 2 puffs Patient not taking: Reported on 03/08/2022 03/06/22   [provider]  ibuprofen (ADVIL) 100 MG tablet Take 100 mg by mouth every 6 (six) hours as needed for fever.    [provider]  Prasterone (INTRAROSA) 6.5 MG INST Place vaginally. 2-3 X A WEEK    [provider]    Current Outpatient Medications  Medication Sig Dispense Refill   cetirizine (ZYRTEC) 10 MG tablet Take 10 mg by mouth daily.     clindamycin (CLEOCIN T) 1 % lotion Apply 1 as directed to affected area once a day     Cyanocobalamin (VITAMIN B12 PO) Vitamin B12 500 mcg tablet   1000 micrograms every day by oral route.     desonide (DESOWEN) 0.05 % cream SMARTSIG:sparingly Topical Twice Daily     Norethin-Eth Estradiol-Fe [facility] FE,WYMZYA FE,ZENCHENT FE,ZEOSA) 0.4-35 MG-MCG tablet Chew 1 tablet by mouth daily.     Olopatadine HCl (PATADAY) 0.2 % SOLN 1 drop into affected eye     Tacrolimus 0.1 % CREA See admin instructions.     albuterol (PROVENTIL HFA;VENTOLIN HFA) 108 (90 BASE) MCG/ACT inhaler Inhale 2 puffs into the lungs every 6 (six) hours as needed. 1 Inhaler 0   budesonide-formoterol (SYMBICORT) 80-4.5 MCG/ACT inhaler 2 puffs (Patient not taking: Reported on 03/08/2022)     ibuprofen (ADVIL) 100 MG tablet Take 100 mg  by mouth every 6 (six) hours as needed for fever.     Prasterone (INTRAROSA) 6.5 MG INST Place vaginally. 2-3 X A WEEK     Current Facility-Administered Medications  Medication Dose Route Frequency Provider Last Rate Last Admin   0.9 %  sodium chloride infusion  500 mL Intravenous Once Mauri Pole, MD        Allergies as of 03/08/2022 - Review Complete 03/08/2022  Allergen Reaction Noted   Peanuts [peanut oil] Rash 10/08/2012   Shellfish allergy Diarrhea 10/08/2012   Bacitracin-polymyxin b Rash 03/30/2019    Family History  Problem Relation Age of Onset   Hyperlipidemia Mother     Thyroid disease Mother    Thyroid cancer Mother 53   Hyperlipidemia Father    Bipolar disorder Father    Neuropathy Father    Colon cancer Neg Hx    Colon polyps Neg Hx    Esophageal cancer Neg Hx    Stomach cancer Neg Hx    Rectal cancer Neg Hx     Social History   Socioeconomic History   Marital status: Married    Spouse name: Not on file   Number of children: Not on file   Years of education: Not on file   Highest education level: Not on file  Occupational History   Not on file  Tobacco Use   Smoking status: Never   Smokeless tobacco: Never  Vaping Use   Vaping Use: Never used  Substance and Sexual Activity   Alcohol use: Yes    Comment: occas   Drug use: No   Sexual activity: Yes  Other Topics Concern   Not on file  Social History Narrative   Not on file   Social Determinants of Health   Financial Resource Strain: Not on file  Food Insecurity: Not on file  Transportation Needs: Not on file  Physical Activity: Not on file  Stress: Not on file  Social Connections: Not on file  Intimate Partner Violence: Not on file    Review of Systems:  All other review of systems negative except as mentioned in the HPI.  Physical Exam: Vital signs in last 24 hours: BP (!) 143/93   Pulse 72   Temp 97.7 F (36.5 C)   Ht '5\' 2"'$  (1.575 m)   Wt 125 lb (56.7 kg)   LMP 02/26/2022 (Approximate)   SpO2 98%   BMI 22.86 kg/m  General:   Alert, NAD Lungs:  Clear .   Heart:  Regular rate and rhythm Abdomen:  Soft, nontender and nondistended. Neuro/Psych:  Alert and cooperative. Normal mood and affect. A and O x 3  Reviewed labs, radiology imaging, old records and pertinent past GI work up  Patient is appropriate for planned procedure(s) and anesthesia in an ambulatory setting   K. Denzil Magnuson , MD [phone]

## 2022-03-08 NOTE — Progress Notes (Signed)
VS by CW  Pt's states no medical or surgical changes since previsit or office visit.  

## 2022-03-08 NOTE — Progress Notes (Signed)
Vss nad trans to pacu °

## 2022-03-11 ENCOUNTER — Telehealth: Payer: Self-pay

## 2022-03-11 NOTE — Telephone Encounter (Signed)
  Follow up Call-     03/08/2022    1:00 PM  Call back number  Post procedure Call Back phone  # [phone]  Permission to leave phone message Yes     Patient questions:  Do you have a fever, pain , or abdominal swelling? No. Pain Score  0 *  Have you tolerated food without any problems? Yes.    Have you been able to return to your normal activities? Yes.    Do you have any questions about your discharge instructions: Diet   No. Medications  No. Follow up visit  No.  Do you have questions or concerns about your Care? No.  Actions: * If pain score is 4 or above: No action needed, pain <4.

## 2022-04-03 ENCOUNTER — Encounter: Payer: Self-pay | Admitting: Gastroenterology

## 2022-04-15 ENCOUNTER — Other Ambulatory Visit: Payer: BC Managed Care – PPO

## 2022-04-15 DIAGNOSIS — R5383 Other fatigue: Secondary | ICD-10-CM

## 2022-04-15 DIAGNOSIS — R718 Other abnormality of red blood cells: Secondary | ICD-10-CM | POA: Diagnosis not present

## 2022-04-15 DIAGNOSIS — Z0000 Encounter for general adult medical examination without abnormal findings: Secondary | ICD-10-CM | POA: Diagnosis not present

## 2022-04-15 DIAGNOSIS — Z136 Encounter for screening for cardiovascular disorders: Secondary | ICD-10-CM

## 2022-04-19 ENCOUNTER — Ambulatory Visit (INDEPENDENT_AMBULATORY_CARE_PROVIDER_SITE_OTHER): Payer: BC Managed Care – PPO | Admitting: Internal Medicine

## 2022-04-19 ENCOUNTER — Encounter: Payer: Self-pay | Admitting: Internal Medicine

## 2022-04-19 VITALS — BP 118/80 | HR 79 | Temp 98.5°F | Ht 62.0 in | Wt 126.2 lb

## 2022-04-19 DIAGNOSIS — J3089 Other allergic rhinitis: Secondary | ICD-10-CM | POA: Diagnosis not present

## 2022-04-19 DIAGNOSIS — Z0000 Encounter for general adult medical examination without abnormal findings: Secondary | ICD-10-CM | POA: Diagnosis not present

## 2022-04-19 DIAGNOSIS — J4520 Mild intermittent asthma, uncomplicated: Secondary | ICD-10-CM

## 2022-04-19 DIAGNOSIS — J3081 Allergic rhinitis due to animal (cat) (dog) hair and dander: Secondary | ICD-10-CM | POA: Diagnosis not present

## 2022-04-19 DIAGNOSIS — J305 Allergic rhinitis due to food: Secondary | ICD-10-CM

## 2022-04-19 DIAGNOSIS — M8580 Other specified disorders of bone density and structure, unspecified site: Secondary | ICD-10-CM | POA: Diagnosis not present

## 2022-04-19 LAB — POCT URINALYSIS DIPSTICK
Bilirubin, UA: NEGATIVE
Blood, UA: NEGATIVE
Glucose, UA: NEGATIVE
Ketones, UA: NEGATIVE
Leukocytes, UA: NEGATIVE
Nitrite, UA: NEGATIVE
Protein, UA: NEGATIVE
Spec Grav, UA: 1.015 (ref 1.010–1.025)
Urobilinogen, UA: 0.2 E.U./dL
pH, UA: 6 (ref 5.0–8.0)

## 2022-04-19 NOTE — Progress Notes (Unsigned)
Subjective:    Patient ID: Tammy Hendrix, female    DOB: 1970-12-21, 51 y.o.   MRN: [id number]  HPI  51 year old  Female seen for annual health maintenance exam.  Her general health is excellent.  She sees Dr. Vincente Poli for GYN care and has annual mammogram done there.Dr. Vincente Poli prescibes hormone replacement therapy for her.  Had colonoscopy by Dr. Lavon Paganini and had 2 small polyps removed  per dictation.  Pathology from cecal polyp was sessile serrated polyp with 5-year follow-up recommended.  Had Tdap update in April 2022.  Should consider pneumococcal vaccine.  Gets annual flu vaccine.  Was vaccinated for COVID x3 in 2021 and had an additional booster in November 2022.  Also should consider Shingrix vaccines x 2.  History of mild intermittent asthma and allergic rhinitis and has seen Dr. Madie Reno recently in June.  She had allergy testing done in 2010 which was positive for dust mite, trees, grasses, shellfish, peanuts, dogs, cats and feathers.  She uses an albuterol inhaler and takes antihistamines for symptoms.  She also has steroid nasal spray.  Her labs are reviewed and are within normal limits including CBC with differential, c-Met, TSH.  Patient requested vitamin B12 level and that result is pending.  She has mild elevation of LDL at 113 and was 108 a year ago.  Total cholesterol is 211 and was 202 a year ago.  Her HDL is excellent currently at 76.  Triglycerides are within normal limits at 110.  She does try to walk some for exercise and is physically active.  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  Has a Masters degree in [employer].  She does not smoke.  Social alcohol consumption.  She has 3 children, 2 daughters and a son.  Daughter will be moving to the [place] area for [employer] this Fall.  Family history: Father is retired physician with history of hyperlipidemia, neuropathy, genetic osteoporosis, esophagitis and gastritis.  Mother  with history of asthma and allergic rhinitis.  Mother also has hypertension.  2 brothers and 2 sisters in good health.  Patient has no known drug allergies.  No history of hospitalizations other than childbirth.  No history of operations.  She has eczema in addition to asthma.  History of mild osteopenia on bone density study in 2021.  Suggest repeat study and order has been placed.  Review of Systems see above- no chest pain or SOB     Objective:   Physical Exam VS reviewed. BMI 23.09,  BP 118/80  Skin: warm and dry. Nodes None.  TMs clear.  Neck supple.  No thyromegaly.  No carotid bruits.  Chest is clear to auscultation without rales or wheezing.  Cardiac exam: Regular rate and rhythm without murmurs or ectopy.  Abdomen: Soft nondistended without hepatosplenomegaly masses or tenderness.  Pelvic exam deferred to GYN physician.  No lower extremity edema or deformity.  Brief neurological exam is intact without gross focal deficits.  Affect health and judgment are normal.  Labs are within normal limits with the exception of mildly elevated LDL of 113.    Assessment & Plan:   Patient now menopausal and is on hormone replacement therapy per GYN.  Bone density study ordered today to be done in the near future  Immunizations discussed-Tdap is up-to-date.  Gets annual flu vaccine.  May want to consider COVID booster in the Fall as well as Shingrix vaccines, Pneumococcal 20 vaccine.  Colonoscopy is up-to-date  Vaccines discussed  Mild elevation of LDL at 113 with total cholesterol of 211.  Can have coronary calcium scoring if so desired.  Recommend watching diet and continuing with exercise regimen.  Can recheck in 1 year.

## 2022-04-19 NOTE — Addendum Note (Signed)
Addended by: Angus Seller on: 04/19/2022 03:34 PM   Modules accepted: Orders

## 2022-04-20 NOTE — Patient Instructions (Addendum)
It was a pleasure to see you today.  B12 level is pending.  Consider coronary calcium scoring if so desired.  Colonoscopy is up-to-date.  Vaccines discussed.  Dr. Helane Rima will be handling hormone replacement therapy.  Suggest pneumococcal 20 vaccine and consideration of Shingrix vaccines.  May want to consider COVID-vaccine in the Fall.  Bone density study ordered.  Mammogram done through GYN.  Return in 1 year or as needed.

## 2022-04-21 LAB — LIPID PANEL
Cholesterol: 211 mg/dL — ABNORMAL HIGH (ref <200)
HDL: 76 mg/dL (ref >=50)
LDL Cholesterol (Calc): 113 mg/dL (calc) — ABNORMAL HIGH
Non-HDL Cholesterol (Calc): 135 mg/dL (calc) — ABNORMAL HIGH (ref <130)
Total CHOL/HDL Ratio: 2.8 (calc) (ref <5.0)
Triglycerides: 110 mg/dL (ref <150)

## 2022-04-21 LAB — CBC WITH DIFFERENTIAL/PLATELET
Absolute Monocytes: 521 cells/uL (ref 200–950)
Basophils Absolute: 56 cells/uL (ref 0–200)
Basophils Relative: 0.6 %
Eosinophils Absolute: 586 cells/uL — ABNORMAL HIGH (ref 15–500)
Eosinophils Relative: 6.3 %
HCT: 39.9 % (ref 35.0–45.0)
Hemoglobin: 13.7 g/dL (ref 11.7–15.5)
Lymphs Abs: 2920 cells/uL (ref 850–3900)
MCH: 34.3 pg — ABNORMAL HIGH (ref 27.0–33.0)
MCHC: 34.3 g/dL (ref 32.0–36.0)
MCV: 100 fL (ref 80.0–100.0)
MPV: 10.3 fL (ref 7.5–12.5)
Monocytes Relative: 5.6 %
Neutro Abs: 5217 cells/uL (ref 1500–7800)
Neutrophils Relative %: 56.1 %
Platelets: 357 10*3/uL (ref 140–400)
RBC: 3.99 10*6/uL (ref 3.80–5.10)
RDW: 12.1 % (ref 11.0–15.0)
Total Lymphocyte: 31.4 %
WBC: 9.3 10*3/uL (ref 3.8–10.8)

## 2022-04-21 LAB — COMPLETE METABOLIC PANEL WITH GFR
AG Ratio: 1.2 (calc) (ref 1.0–2.5)
ALT: 9 U/L (ref 6–29)
AST: 13 U/L (ref 10–35)
Albumin: 3.6 g/dL (ref 3.6–5.1)
Alkaline phosphatase (APISO): 54 U/L (ref 37–153)
BUN: 12 mg/dL (ref 7–25)
CO2: 23 mmol/L (ref 20–32)
Calcium: 9.2 mg/dL (ref 8.6–10.4)
Chloride: 106 mmol/L (ref 98–110)
Creat: 0.94 mg/dL (ref 0.50–1.03)
Globulin: 3.1 g/dL (calc) (ref 1.9–3.7)
Glucose, Bld: 81 mg/dL (ref 65–99)
Potassium: 4.7 mmol/L (ref 3.5–5.3)
Sodium: 139 mmol/L (ref 135–146)
Total Bilirubin: 0.8 mg/dL (ref 0.2–1.2)
Total Protein: 6.7 g/dL (ref 6.1–8.1)
eGFR: 73 mL/min/{1.73_m2} (ref >=60)

## 2022-04-21 LAB — VITAMIN B12: Vitamin B-12: 773 pg/mL (ref 200–1100)

## 2022-04-21 LAB — TSH: TSH: 2.38 mIU/L

## 2022-09-04 DIAGNOSIS — Z1231 Encounter for screening mammogram for malignant neoplasm of breast: Secondary | ICD-10-CM | POA: Diagnosis not present

## 2022-09-04 DIAGNOSIS — Z01419 Encounter for gynecological examination (general) (routine) without abnormal findings: Secondary | ICD-10-CM | POA: Diagnosis not present

## 2022-09-04 DIAGNOSIS — Z6822 Body mass index (BMI) 22.0-22.9, adult: Secondary | ICD-10-CM | POA: Diagnosis not present

## 2022-09-04 DIAGNOSIS — Z124 Encounter for screening for malignant neoplasm of cervix: Secondary | ICD-10-CM | POA: Diagnosis not present

## 2022-09-04 LAB — HM MAMMOGRAPHY

## 2022-09-06 LAB — HM PAP SMEAR

## 2022-12-10 DIAGNOSIS — J301 Allergic rhinitis due to pollen: Secondary | ICD-10-CM | POA: Diagnosis not present

## 2022-12-10 DIAGNOSIS — J4520 Mild intermittent asthma, uncomplicated: Secondary | ICD-10-CM | POA: Diagnosis not present

## 2022-12-10 DIAGNOSIS — J3089 Other allergic rhinitis: Secondary | ICD-10-CM | POA: Diagnosis not present

## 2022-12-10 DIAGNOSIS — H1045 Other chronic allergic conjunctivitis: Secondary | ICD-10-CM | POA: Diagnosis not present

## 2023-02-06 DIAGNOSIS — F4322 Adjustment disorder with anxiety: Secondary | ICD-10-CM | POA: Diagnosis not present

## 2023-04-10 DIAGNOSIS — F4322 Adjustment disorder with anxiety: Secondary | ICD-10-CM | POA: Diagnosis not present

## 2023-04-24 NOTE — Progress Notes (Addendum)
Patient Care Team: Margaree Mackintosh, MD as PCP - General (Internal Medicine)  Visit Date: 05/01/23  Subjective:    Patient ID: Tammy Hendrix , Female   DOB: [birth year], 52 y.o.    MRN: [id number]   52 y.o. Female presents today for annual comprehensive physical exam.   Her general health is excellent.    History of mild intermittent asthma and allergic rhinitis and has seen Dr. Madie Reno recently in March. She had allergy testing done in 2010 which was positive for dust mite, trees, grasses, shellfish, peanuts, dogs, cats and feathers.  She uses an albuterol inhaler and takes antihistamines for symptoms.  She also has steroid nasal spray.  Patient has no known drug allergies.  No history of hospitalizations other than childbirth.  No history of operations.  She has eczema in addition to asthma.  History of mild osteopenia on bone density study in 2021.  Suggest repeat study and order has been placed.  Glucose normal. Kidney, liver functions normal. Electrolytes normal. Blood proteins normal. MCH elevated at 33.1. LDL elevated at 110. TSH 2.56.  She sees Dr. Vincente Poli for GYN care and has annual mammogram done there. Dr. Vincente Poli prescibes hormone replacement therapy for her.  Had colonoscopy by Dr. Lavon Paganini 03/08/22 and had 2 small polyps removed  per dictation.  Pathology from cecal polyp was sessile serrated polyp with 5-year follow-up recommended.  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  Has a Masters degree in [employer].  She does not smoke.  Social alcohol consumption.  She has 3 children, 2 daughters and a son.   Family history: Father is retired physician with history of hyperlipidemia, neuropathy, genetic osteoporosis, esophagitis and gastritis.  Mother with history of asthma and allergic rhinitis.  Mother also has hypertension.  2 brothers and 2 sisters in good health.  Past Medical History:  Diagnosis Date   Allergy    SEASONAL    Anemia    AFTER 3RD BABY   Asthma    Dermatitis    Heart murmur    TOLD AS A CHILD HAD MURMUR BUT NO ONE SAID SINCE     Family History  Problem Relation Age of Onset   Hyperlipidemia Mother    Thyroid disease Mother    Thyroid cancer Mother 20   Hyperlipidemia Father    Bipolar disorder Father    Neuropathy Father    Colon cancer Neg Hx    Colon polyps Neg Hx    Esophageal cancer Neg Hx    Stomach cancer Neg Hx    Rectal cancer Neg Hx          Review of Systems  Constitutional:  Negative for chills, fever, malaise/fatigue and weight loss.  HENT:  Negative for hearing loss, sinus pain and sore throat.   Respiratory:  Negative for cough, hemoptysis and shortness of breath.   Cardiovascular:  Negative for chest pain, palpitations, leg swelling and PND.  Gastrointestinal:  Negative for abdominal pain, constipation, diarrhea, heartburn, nausea and vomiting.  Genitourinary:  Negative for dysuria, frequency and urgency.  Musculoskeletal:  Negative for back pain, myalgias and neck pain.  Skin:  Negative for itching and rash.  Neurological:  Negative for dizziness, tingling, seizures and headaches.  Endo/Heme/Allergies:  Negative for polydipsia.  Psychiatric/Behavioral:  Negative for depression. The patient is not nervous/anxious.         Objective:   Vitals: BP 124/70   Pulse 72  Temp 98.8 F (37.1 C) (Tympanic)   Wt 138 lb 12.8 oz (63 kg)   SpO2 98%   BMI 25.39 kg/m    Ht 76ft. 2 in   Physical Exam Vitals and nursing note reviewed.  Constitutional:      General: She is not in acute distress.    Appearance: Normal appearance. She is not ill-appearing or toxic-appearing.  HENT:     Head: Normocephalic and atraumatic.     Right Ear: Hearing, tympanic membrane, ear canal and external ear normal.     Left Ear: Hearing, tympanic membrane, ear canal and external ear normal.     Mouth/Throat:     Pharynx: Oropharynx is clear.  Eyes:     Extraocular Movements:  Extraocular movements intact.     Pupils: Pupils are equal, round, and reactive to light.  Neck:     Thyroid: No thyroid mass, thyromegaly or thyroid tenderness.     Vascular: No carotid bruit.  Cardiovascular:     Rate and Rhythm: Normal rate and regular rhythm. No extrasystoles are present.    Heart sounds: Normal heart sounds. No murmur heard.    No friction rub. No gallop.  Pulmonary:     Effort: Pulmonary effort is normal.     Breath sounds: Normal breath sounds. No decreased breath sounds, wheezing, rhonchi or rales.  Chest:     Chest wall: No mass.  Abdominal:     Palpations: Abdomen is soft. There is no hepatomegaly, splenomegaly or mass.     Tenderness: There is no abdominal tenderness.     Hernia: No hernia is present.  Musculoskeletal:     Cervical back: Normal range of motion.     Right lower leg: No edema.     Left lower leg: No edema.  Lymphadenopathy:     Cervical: No cervical adenopathy.     Upper Body:     Right upper body: No supraclavicular adenopathy.     Left upper body: No supraclavicular adenopathy.  Skin:    General: Skin is warm and dry.  Neurological:     General: No focal deficit present.     Mental Status: She is alert and oriented to person, place, and time. Mental status is at baseline.     Sensory: Sensation is intact.     Motor: Motor function is intact. No weakness.     Deep Tendon Reflexes: Reflexes are normal and symmetric.  Psychiatric:        Attention and Perception: Attention normal.        Mood and Affect: Mood normal.        Speech: Speech normal.        Behavior: Behavior normal.        Thought Content: Thought content normal.        Cognition and Memory: Cognition normal.        Judgment: Judgment normal.       Results:   Studies obtained and personally reviewed by me:  Had colonoscopy by Dr. Lavon Paganini 03/08/22 and had 2 small polyps removed  per dictation.  Pathology from cecal polyp was sessile serrated polyp with 5-year  follow-up recommended.  Labs:       Component Value Date/Time   NA 138 04/28/2023 0911   K 5.0 04/28/2023 0911   CL 104 04/28/2023 0911   CO2 28 04/28/2023 0911   GLUCOSE 83 04/28/2023 0911   BUN 12 04/28/2023 0911   CREATININE 0.80 04/28/2023 0911   CALCIUM 9.3  04/28/2023 0911   PROT 6.8 04/28/2023 0911   ALBUMIN 3.7 10/06/2012 0915   AST 14 04/28/2023 0911   ALT 8 04/28/2023 0911   ALKPHOS 55 10/06/2012 0915   BILITOT 0.7 04/28/2023 0911   GFRNONAA 77 01/15/2021 1203   GFRAA 89 01/15/2021 1203     Lab Results  Component Value Date   WBC 7.9 04/28/2023   HGB 13.6 04/28/2023   HCT 40.2 04/28/2023   MCV 97.8 04/28/2023   PLT 382 04/28/2023    Lab Results  Component Value Date   CHOL 207 (H) 04/28/2023   HDL 75 04/28/2023   LDLCALC 110 (H) 04/28/2023   TRIG 112 04/28/2023   CHOLHDL 2.8 04/28/2023     Lab Results  Component Value Date   TSH 2.56 04/28/2023      Assessment & Plan:   Mild elevation of LDL: she will work on healthy diet and exercise.  Urinalysis normal.  Pelvic and breast exams deferred to GYN.  Had colonoscopy by Dr. Lavon Paganini 03/08/22 and had 2 small polyps removed  per dictation.  Pathology from cecal polyp was sessile serrated polyp with 5-year follow-up recommended.  Vaccine counseling: UTD on tetanus vaccine. She will consider Covid-19 booster in the fall.  Return in 1 year or as needed.    I,Alexander Ruley,acting as a Neurosurgeon for Margaree Mackintosh, MD.,have documented all relevant documentation on the behalf of Margaree Mackintosh, MD,as directed by  Margaree Mackintosh, MD while in the presence of Margaree Mackintosh, MD.   I, Margaree Mackintosh, MD, have reviewed all documentation for this visit. The documentation on 05/21/23 for the exam, diagnosis, procedures, and orders are all accurate and complete.

## 2023-04-28 ENCOUNTER — Other Ambulatory Visit: Payer: BC Managed Care – PPO

## 2023-04-28 DIAGNOSIS — Z13220 Encounter for screening for lipoid disorders: Secondary | ICD-10-CM | POA: Diagnosis not present

## 2023-04-28 DIAGNOSIS — Z1329 Encounter for screening for other suspected endocrine disorder: Secondary | ICD-10-CM | POA: Diagnosis not present

## 2023-04-28 DIAGNOSIS — Z0000 Encounter for general adult medical examination without abnormal findings: Secondary | ICD-10-CM | POA: Diagnosis not present

## 2023-04-28 LAB — CBC WITH DIFFERENTIAL/PLATELET
Absolute Monocytes: 561 cells/uL (ref 200–950)
Basophils Absolute: 47 cells/uL (ref 0–200)
Basophils Relative: 0.6 %
Eosinophils Absolute: 442 cells/uL (ref 15–500)
Eosinophils Relative: 5.6 %
HCT: 40.2 % (ref 35.0–45.0)
Hemoglobin: 13.6 g/dL (ref 11.7–15.5)
Lymphs Abs: 2386 cells/uL (ref 850–3900)
MCH: 33.1 pg — ABNORMAL HIGH (ref 27.0–33.0)
MCHC: 33.8 g/dL (ref 32.0–36.0)
MCV: 97.8 fL (ref 80.0–100.0)
MPV: 10.3 fL (ref 7.5–12.5)
Monocytes Relative: 7.1 %
Neutro Abs: 4464 cells/uL (ref 1500–7800)
Neutrophils Relative %: 56.5 %
Platelets: 382 10*3/uL (ref 140–400)
RBC: 4.11 10*6/uL (ref 3.80–5.10)
RDW: 11.9 % (ref 11.0–15.0)
Total Lymphocyte: 30.2 %
WBC: 7.9 10*3/uL (ref 3.8–10.8)

## 2023-05-01 ENCOUNTER — Telehealth: Payer: Self-pay | Admitting: Internal Medicine

## 2023-05-01 ENCOUNTER — Ambulatory Visit (INDEPENDENT_AMBULATORY_CARE_PROVIDER_SITE_OTHER): Payer: BC Managed Care – PPO | Admitting: Internal Medicine

## 2023-05-01 VITALS — BP 124/70 | HR 72 | Temp 98.8°F | Resp 12 | Ht 62.0 in | Wt 138.8 lb

## 2023-05-01 DIAGNOSIS — J3081 Allergic rhinitis due to animal (cat) (dog) hair and dander: Secondary | ICD-10-CM

## 2023-05-01 DIAGNOSIS — Z780 Asymptomatic menopausal state: Secondary | ICD-10-CM

## 2023-05-01 DIAGNOSIS — Z0000 Encounter for general adult medical examination without abnormal findings: Secondary | ICD-10-CM

## 2023-05-01 DIAGNOSIS — M8580 Other specified disorders of bone density and structure, unspecified site: Secondary | ICD-10-CM

## 2023-05-01 DIAGNOSIS — J305 Allergic rhinitis due to food: Secondary | ICD-10-CM | POA: Diagnosis not present

## 2023-05-01 DIAGNOSIS — J3089 Other allergic rhinitis: Secondary | ICD-10-CM

## 2023-05-01 DIAGNOSIS — J4520 Mild intermittent asthma, uncomplicated: Secondary | ICD-10-CM | POA: Diagnosis not present

## 2023-05-01 DIAGNOSIS — J301 Allergic rhinitis due to pollen: Secondary | ICD-10-CM

## 2023-05-01 LAB — POCT URINALYSIS DIP (CLINITEK)
Bilirubin, UA: NEGATIVE
Blood, UA: NEGATIVE
Glucose, UA: NEGATIVE mg/dL
Ketones, POC UA: NEGATIVE mg/dL
Leukocytes, UA: NEGATIVE
Nitrite, UA: NEGATIVE
POC PROTEIN,UA: NEGATIVE
Spec Grav, UA: 1.015 (ref 1.010–1.025)
Urobilinogen, UA: 0.2 E.U./dL
pH, UA: 6 (ref 5.0–8.0)

## 2023-05-01 NOTE — Telephone Encounter (Signed)
LVM to CB to close early

## 2023-05-21 ENCOUNTER — Encounter: Payer: Self-pay | Admitting: Internal Medicine

## 2023-05-21 NOTE — Patient Instructions (Signed)
It was a pleasure to see you today. Return in one year or as needed. Please have annual mammogram and updated bone density study. Consider Covid and Flu boosters this Fall.

## 2023-05-22 DIAGNOSIS — F4322 Adjustment disorder with anxiety: Secondary | ICD-10-CM | POA: Diagnosis not present

## 2023-07-22 DIAGNOSIS — F4322 Adjustment disorder with anxiety: Secondary | ICD-10-CM | POA: Diagnosis not present

## 2023-08-08 DIAGNOSIS — J4520 Mild intermittent asthma, uncomplicated: Secondary | ICD-10-CM | POA: Diagnosis not present

## 2023-08-08 DIAGNOSIS — J3089 Other allergic rhinitis: Secondary | ICD-10-CM | POA: Diagnosis not present

## 2023-08-08 DIAGNOSIS — J301 Allergic rhinitis due to pollen: Secondary | ICD-10-CM | POA: Diagnosis not present

## 2023-08-08 DIAGNOSIS — H1045 Other chronic allergic conjunctivitis: Secondary | ICD-10-CM | POA: Diagnosis not present

## 2023-09-29 DIAGNOSIS — J3089 Other allergic rhinitis: Secondary | ICD-10-CM | POA: Diagnosis not present

## 2023-09-29 DIAGNOSIS — H1045 Other chronic allergic conjunctivitis: Secondary | ICD-10-CM | POA: Diagnosis not present

## 2023-09-29 DIAGNOSIS — J4520 Mild intermittent asthma, uncomplicated: Secondary | ICD-10-CM | POA: Diagnosis not present

## 2023-09-29 DIAGNOSIS — J301 Allergic rhinitis due to pollen: Secondary | ICD-10-CM | POA: Diagnosis not present

## 2023-10-10 DIAGNOSIS — Z6822 Body mass index (BMI) 22.0-22.9, adult: Secondary | ICD-10-CM | POA: Diagnosis not present

## 2023-10-10 DIAGNOSIS — Z01419 Encounter for gynecological examination (general) (routine) without abnormal findings: Secondary | ICD-10-CM | POA: Diagnosis not present

## 2023-10-10 DIAGNOSIS — Z1231 Encounter for screening mammogram for malignant neoplasm of breast: Secondary | ICD-10-CM | POA: Diagnosis not present

## 2023-10-10 LAB — HM MAMMOGRAPHY

## 2023-10-20 DIAGNOSIS — J4520 Mild intermittent asthma, uncomplicated: Secondary | ICD-10-CM | POA: Diagnosis not present

## 2023-10-20 DIAGNOSIS — J301 Allergic rhinitis due to pollen: Secondary | ICD-10-CM | POA: Diagnosis not present

## 2023-10-20 DIAGNOSIS — J3089 Other allergic rhinitis: Secondary | ICD-10-CM | POA: Diagnosis not present

## 2023-10-20 DIAGNOSIS — J209 Acute bronchitis, unspecified: Secondary | ICD-10-CM | POA: Diagnosis not present

## 2023-12-04 DIAGNOSIS — F4322 Adjustment disorder with anxiety: Secondary | ICD-10-CM | POA: Diagnosis not present

## 2024-01-24 ENCOUNTER — Emergency Department (HOSPITAL_COMMUNITY)

## 2024-01-24 ENCOUNTER — Encounter (HOSPITAL_COMMUNITY): Payer: Self-pay | Admitting: Emergency Medicine

## 2024-01-24 ENCOUNTER — Emergency Department (HOSPITAL_COMMUNITY)
Admission: EM | Admit: 2024-01-24 | Discharge: 2024-01-24 | Disposition: A | Discharge status: Home / Self Care | Attending: Emergency Medicine | Admitting: Emergency Medicine

## 2024-01-24 ENCOUNTER — Other Ambulatory Visit: Payer: Self-pay

## 2024-01-24 DIAGNOSIS — R1032 Left lower quadrant pain: Secondary | ICD-10-CM | POA: Diagnosis not present

## 2024-01-24 DIAGNOSIS — R1110 Vomiting, unspecified: Secondary | ICD-10-CM | POA: Insufficient documentation

## 2024-01-24 DIAGNOSIS — N134 Hydroureter: Secondary | ICD-10-CM | POA: Diagnosis not present

## 2024-01-24 DIAGNOSIS — N2889 Other specified disorders of kidney and ureter: Secondary | ICD-10-CM | POA: Insufficient documentation

## 2024-01-24 DIAGNOSIS — R9431 Abnormal electrocardiogram [ECG] [EKG]: Secondary | ICD-10-CM | POA: Diagnosis not present

## 2024-01-24 DIAGNOSIS — R109 Unspecified abdominal pain: Secondary | ICD-10-CM | POA: Diagnosis not present

## 2024-01-24 DIAGNOSIS — N1330 Unspecified hydronephrosis: Secondary | ICD-10-CM | POA: Diagnosis not present

## 2024-01-24 DIAGNOSIS — Z91010 Allergy to peanuts: Secondary | ICD-10-CM | POA: Diagnosis not present

## 2024-01-24 DIAGNOSIS — J90 Pleural effusion, not elsewhere classified: Secondary | ICD-10-CM | POA: Diagnosis not present

## 2024-01-24 DIAGNOSIS — J9811 Atelectasis: Secondary | ICD-10-CM | POA: Diagnosis not present

## 2024-01-24 LAB — CBC
HCT: 40 % (ref 36.0–46.0)
Hemoglobin: 13.1 g/dL (ref 12.0–15.0)
MCH: 32.5 pg (ref 26.0–34.0)
MCHC: 32.8 g/dL (ref 30.0–36.0)
MCV: 99.3 fL (ref 80.0–100.0)
Platelets: 448 10*3/uL — ABNORMAL HIGH (ref 150–400)
RBC: 4.03 MIL/uL (ref 3.87–5.11)
RDW: 12.5 % (ref 11.5–15.5)
WBC: 12.1 10*3/uL — ABNORMAL HIGH (ref 4.0–10.5)
nRBC: 0 % (ref 0.0–0.2)

## 2024-01-24 LAB — COMPREHENSIVE METABOLIC PANEL WITH GFR
ALT: 14 U/L (ref 0–44)
AST: 17 U/L (ref 15–41)
Albumin: 3 g/dL — ABNORMAL LOW (ref 3.5–5.0)
Alkaline Phosphatase: 71 U/L (ref 38–126)
Anion gap: 10 (ref 5–15)
BUN: 14 mg/dL (ref 6–20)
CO2: 22 mmol/L (ref 22–32)
Calcium: 9.8 mg/dL (ref 8.9–10.3)
Chloride: 105 mmol/L (ref 98–111)
Creatinine, Ser: 1.06 mg/dL — ABNORMAL HIGH (ref 0.44–1.00)
GFR, Estimated: >60 mL/min (ref >60)
Glucose, Bld: 112 mg/dL — ABNORMAL HIGH (ref 70–99)
Potassium: 3.6 mmol/L (ref 3.5–5.1)
Sodium: 137 mmol/L (ref 135–145)
Total Bilirubin: 0.7 mg/dL (ref 0.0–1.2)
Total Protein: 7.2 g/dL (ref 6.5–8.1)

## 2024-01-24 LAB — URINALYSIS, ROUTINE W REFLEX MICROSCOPIC
Bilirubin Urine: NEGATIVE
Glucose, UA: NEGATIVE mg/dL
Ketones, ur: NEGATIVE mg/dL
Nitrite: NEGATIVE
Protein, ur: NEGATIVE mg/dL
RBC / HPF: >50 RBC/hpf (ref 0–5)
Specific Gravity, Urine: 1.025 (ref 1.005–1.030)
pH: 5 (ref 5.0–8.0)

## 2024-01-24 LAB — LIPASE, BLOOD: Lipase: 25 U/L (ref 11–51)

## 2024-01-24 LAB — HCG, SERUM, QUALITATIVE: Preg, Serum: NEGATIVE

## 2024-01-24 MED ORDER — HYDROMORPHONE HCL 1 MG/ML IJ SOLN
0.5000 mg | Freq: Once | INTRAMUSCULAR | Status: AC
  Administered 2024-01-24: 0.5 mg via INTRAVENOUS
  Filled 2024-01-24: qty 1

## 2024-01-24 MED ORDER — SODIUM CHLORIDE 0.9 % IV BOLUS
1000.0000 mL | Freq: Once | INTRAVENOUS | Status: AC
  Administered 2024-01-24: 1000 mL via INTRAVENOUS

## 2024-01-24 MED ORDER — ONDANSETRON 4 MG PO TBDP: 4.0000 mg | ORAL_TABLET | Freq: Three times a day (TID) | ORAL | 0 refills | Status: DC | PRN

## 2024-01-24 MED ORDER — ONDANSETRON HCL 4 MG/2ML IJ SOLN
4.0000 mg | Freq: Once | INTRAMUSCULAR | Status: AC
  Administered 2024-01-24: 4 mg via INTRAVENOUS
  Filled 2024-01-24: qty 2

## 2024-01-24 MED ORDER — HYDROMORPHONE HCL 1 MG/ML IJ SOLN
1.0000 mg | Freq: Once | INTRAMUSCULAR | Status: AC
  Administered 2024-01-24: 1 mg via INTRAVENOUS
  Filled 2024-01-24: qty 1

## 2024-01-24 MED ORDER — IOHEXOL 350 MG/ML SOLN
100.0000 mL | Freq: Once | INTRAVENOUS | Status: AC | PRN
  Administered 2024-01-24: 100 mL via INTRAVENOUS

## 2024-01-24 MED ORDER — OXYCODONE HCL 5 MG PO TABS
5.0000 mg | ORAL_TABLET | Freq: Four times a day (QID) | ORAL | 0 refills | Status: DC | PRN
Start: 2024-01-24 — End: 2024-02-19

## 2024-01-24 NOTE — ED Provider Notes (Addendum)
 [place] AT [facility] Provider Note   CSN: [id number] Arrival date & time: 01/24/24  4098     History  Chief Complaint  Patient presents with   Abdominal Pain    Tammy Hendrix is a 53 y.o. female.  Patient with acute onset of left-sided abdominal pain kind of overnight.  Associated with some vomiting.  Maybe thought there was some blood in her urine.  No prior symptoms.  Past medical history significant for asthma.  Past surgical history just vaginal delivery.  Patient has never used tobacco products       Home Medications Prior to Admission medications   Medication Sig Start Date End Date Taking? Authorizing Provider  albuterol  (VENTOLIN  HFA) 108 (90 Base) MCG/ACT inhaler Inhale 2 puffs into the lungs every 6 (six) hours as needed for shortness of breath. 10/20/23  Yes [provider]  budesonide-formoterol (SYMBICORT) 80-4.5 MCG/ACT inhaler SMARTSIG:By Mouth   Yes [provider]  cetirizine (ZYRTEC) 10 MG tablet Take 10 mg by mouth daily.   Yes [provider]  clindamycin (CLEOCIN T) 1 % lotion Apply 1 as directed to affected area once a day 12/04/21  Yes [provider]  Cyanocobalamin  (VITAMIN B12 PO) Vitamin B12 500 mcg tablet   1000 micrograms every day by oral route. 08/07/21  Yes [provider]  desonide (DESOWEN) 0.05 % cream SMARTSIG:sparingly Topical Twice Daily 12/04/21  Yes [provider]  ibuprofen  (ADVIL ) 100 MG tablet Take 100 mg by mouth every 6 (six) hours as needed for fever.   Yes [provider]  Norethin-Eth Estradiol-Fe [employer] FE,WYMZYA Fayne Hoover) 0.4-35 MG-MCG tablet Chew 1 tablet by mouth daily.   Yes [provider]  Olopatadine HCl (PATADAY) 0.2 % SOLN 1 drop into affected eye   Yes [provider]  ondansetron  (ZOFRAN -ODT) 4 MG disintegrating tablet Take 1 tablet (4 mg total) by mouth every 8 (eight) hours as needed for nausea  or vomiting. 01/24/24  Yes Taber Sweetser, MD  oxyCODONE (ROXICODONE) 5 MG immediate release tablet Take 1 tablet (5 mg total) by mouth every 6 (six) hours as needed for severe pain (pain score 7-10) or moderate pain (pain score 4-6). 01/24/24  Yes Kayslee Furey, MD  Prasterone (INTRAROSA) 6.5 MG INST Place vaginally. 2-3 X A WEEK   Yes [provider]      Allergies    Peanuts [peanut oil], Shellfish allergy, and Bacitracin-polymyxin b    Review of Systems   Review of Systems  Constitutional:  Negative for activity change, appetite change, chills and fever.  HENT:  Negative for rhinorrhea and sore throat.   Eyes:  Negative for visual disturbance.  Respiratory:  Negative for cough and shortness of breath.   Cardiovascular:  Negative for chest pain and leg swelling.  Gastrointestinal:  Positive for abdominal pain, nausea and vomiting. Negative for diarrhea.  Genitourinary:  Positive for flank pain. Negative for dysuria.  Musculoskeletal:  Negative for back pain and neck pain.  Skin:  Negative for rash.  Neurological:  Negative for dizziness, light-headedness and headaches.  Hematological:  Does not bruise/bleed easily.  Psychiatric/Behavioral:  Negative for confusion.     Physical Exam Updated Vital Signs BP (!) 143/78   Pulse 65   Temp 97.9 F (36.6 C) (Oral)   Resp 15   SpO2 100%  Physical Exam Vitals and nursing note reviewed.  Constitutional:      General: She is not in acute distress.  Appearance: Normal appearance. She is well-developed.  HENT:     Head: Normocephalic and atraumatic.  Eyes:     Extraocular Movements: Extraocular movements intact.     Conjunctiva/sclera: Conjunctivae normal.     Pupils: Pupils are equal, round, and reactive to light.  Cardiovascular:     Rate and Rhythm: Normal rate and regular rhythm.     Heart sounds: No murmur heard. Pulmonary:     Effort: Pulmonary effort is normal. No respiratory distress.     Breath sounds: Normal  breath sounds.  Abdominal:     General: There is no distension.     Palpations: Abdomen is soft.     Tenderness: There is no abdominal tenderness. There is no guarding.  Musculoskeletal:        General: No swelling.     Cervical back: Normal range of motion and neck supple.  Skin:    General: Skin is warm and dry.     Capillary Refill: Capillary refill takes less than 2 seconds.  Neurological:     General: No focal deficit present.     Mental Status: She is alert and oriented to person, place, and time.  Psychiatric:        Mood and Affect: Mood normal.     ED Results / Procedures / Treatments   Labs (all labs ordered are listed, but only abnormal results are displayed) Labs Reviewed  COMPREHENSIVE METABOLIC PANEL WITH GFR - Abnormal; Notable for the following components:      Result Value   Glucose, Bld 112 (*)    Creatinine, Ser 1.06 (*)    Albumin 3.0 (*)    All other components within normal limits  CBC - Abnormal; Notable for the following components:   WBC 12.1 (*)    Platelets 448 (*)    All other components within normal limits  URINALYSIS, ROUTINE W REFLEX MICROSCOPIC - Abnormal; Notable for the following components:   APPearance CLOUDY (*)    Hgb urine dipstick LARGE (*)    Leukocytes,Ua SMALL (*)    Bacteria, UA RARE (*)    All other components within normal limits  URINE CULTURE  LIPASE, BLOOD  HCG, SERUM, QUALITATIVE    EKG EKG Interpretation Date/Time:  Saturday Jan 24 2024 11:25:54 EDT Ventricular Rate:  80 PR Interval:  147 QRS Duration:  80 QT Interval:  397 QTC Calculation: 458 R Axis:   97  Text Interpretation: Sinus rhythm Borderline right axis deviation No previous ECGs available ) Confirmed by Daimien Patmon [phone]) on 01/24/2024 11:50:42 AM  Radiology CT CHEST W CONTRAST Result Date: 01/24/2024 CLINICAL DATA:  Large left renal mass. EXAM: CT CHEST, ABDOMEN, AND PELVIS WITH CONTRAST TECHNIQUE: Multidetector CT imaging of the chest,  abdomen and pelvis was performed following the standard protocol during bolus administration of intravenous contrast. RADIATION DOSE REDUCTION: This exam was performed according to the departmental dose-optimization program which includes automated exposure control, adjustment of the mA and/or kV according to patient size and/or use of iterative reconstruction technique. CONTRAST:  OMNIPAQUE IOHEXOL 350 MG/ML SOLN COMPARISON:  01/24/2024. FINDINGS: CT CHEST FINDINGS Cardiovascular: The heart is normal in size and there is no pericardial effusion. The aorta and pulmonary trunk are normal in caliber. Mediastinum/Nodes: No mediastinal, hilar, or axillary lymphadenopathy. Subcentimeter hypodensities are present in the left lobe of the thyroid gland. No additional imaging is recommended. The trachea and esophagus are within normal limits. Lungs/Pleura: There is a trace right pleural effusion. Atelectasis is noted bilaterally.  No consolidation or pneumothorax is seen. No pulmonary nodule or mass is seen. Musculoskeletal: No acute or suspicious osseous abnormality is seen. CT ABDOMEN PELVIS FINDINGS Hepatobiliary: No focal liver abnormality is seen. No gallstones, gallbladder wall thickening, or biliary dilatation. Pancreas: Unremarkable. No pancreatic ductal dilatation or surrounding inflammatory changes. Spleen: Normal in size without focal abnormality. Adrenals/Urinary Tract: The right adrenal gland is within normal limits. The left adrenal gland is not well seen. There is a heterogeneous cystic and solid mass in the mid to upper left kidney measuring 9.9 x 6.8 x 8.1 cm containing a few calcifications. The tumor abuts of the spleen and tail of the pancreas. There is mild-to-moderate hydronephrosis involving in the lower pole of the left kidney without evidence obstructing lesion. No renal calculus or obstructive uropathy on the right. The bladder is unremarkable. No obvious renal vein thrombosis is seen. Multiple  varices are noted in the perirenal space on the left. Stomach/Bowel: Stomach is within normal limits. Appendix appears normal. No evidence of bowel wall thickening, distention, or inflammatory changes. No free air or pneumatosis is seen. Vascular/Lymphatic: Aortic atherosclerosis. No enlarged abdominal or pelvic lymph nodes. Reproductive: Uterus and bilateral adnexa are unremarkable. Other: Left perinephric edema and a small amount of free fluid is noted. Musculoskeletal: No acute or suspicious osseous abnormality. IMPRESSION: 1. Heterogeneous mass with cystic and solid regions and calcifications in the mid to upper left kidney measuring 9.9 x 6.8 x 8.1 cm, concerning for renal cell carcinoma. There is moderate hydronephrosis involving the lower pole of the left kidney without evidence of obstructing lesion. There is no definite evidence renal vein thrombosis or lymphadenopathy. 2. Trace right pleural effusion with atelectasis at the lung bases. 3. Aortic atherosclerosis. Electronically Signed   By: Wyvonnia Heimlich M.D.   On: 01/24/2024 14:44   CT ABDOMEN PELVIS W CONTRAST Result Date: 01/24/2024 CLINICAL DATA:  Large left renal mass. EXAM: CT CHEST, ABDOMEN, AND PELVIS WITH CONTRAST TECHNIQUE: Multidetector CT imaging of the chest, abdomen and pelvis was performed following the standard protocol during bolus administration of intravenous contrast. RADIATION DOSE REDUCTION: This exam was performed according to the departmental dose-optimization program which includes automated exposure control, adjustment of the mA and/or kV according to patient size and/or use of iterative reconstruction technique. CONTRAST:  OMNIPAQUE IOHEXOL 350 MG/ML SOLN COMPARISON:  01/24/2024. FINDINGS: CT CHEST FINDINGS Cardiovascular: The heart is normal in size and there is no pericardial effusion. The aorta and pulmonary trunk are normal in caliber. Mediastinum/Nodes: No mediastinal, hilar, or axillary lymphadenopathy. Subcentimeter  hypodensities are present in the left lobe of the thyroid gland. No additional imaging is recommended. The trachea and esophagus are within normal limits. Lungs/Pleura: There is a trace right pleural effusion. Atelectasis is noted bilaterally. No consolidation or pneumothorax is seen. No pulmonary nodule or mass is seen. Musculoskeletal: No acute or suspicious osseous abnormality is seen. CT ABDOMEN PELVIS FINDINGS Hepatobiliary: No focal liver abnormality is seen. No gallstones, gallbladder wall thickening, or biliary dilatation. Pancreas: Unremarkable. No pancreatic ductal dilatation or surrounding inflammatory changes. Spleen: Normal in size without focal abnormality. Adrenals/Urinary Tract: The right adrenal gland is within normal limits. The left adrenal gland is not well seen. There is a heterogeneous cystic and solid mass in the mid to upper left kidney measuring 9.9 x 6.8 x 8.1 cm containing a few calcifications. The tumor abuts of the spleen and tail of the pancreas. There is mild-to-moderate hydronephrosis involving in the lower pole of the left kidney without  evidence obstructing lesion. No renal calculus or obstructive uropathy on the right. The bladder is unremarkable. No obvious renal vein thrombosis is seen. Multiple varices are noted in the perirenal space on the left. Stomach/Bowel: Stomach is within normal limits. Appendix appears normal. No evidence of bowel wall thickening, distention, or inflammatory changes. No free air or pneumatosis is seen. Vascular/Lymphatic: Aortic atherosclerosis. No enlarged abdominal or pelvic lymph nodes. Reproductive: Uterus and bilateral adnexa are unremarkable. Other: Left perinephric edema and a small amount of free fluid is noted. Musculoskeletal: No acute or suspicious osseous abnormality. IMPRESSION: 1. Heterogeneous mass with cystic and solid regions and calcifications in the mid to upper left kidney measuring 9.9 x 6.8 x 8.1 cm, concerning for renal cell  carcinoma. There is moderate hydronephrosis involving the lower pole of the left kidney without evidence of obstructing lesion. There is no definite evidence renal vein thrombosis or lymphadenopathy. 2. Trace right pleural effusion with atelectasis at the lung bases. 3. Aortic atherosclerosis. Electronically Signed   By: Wyvonnia Heimlich M.D.   On: 01/24/2024 14:44   CT Renal Stone Study Result Date: 01/24/2024 CLINICAL DATA:  Abdominal pain/flank pain. * Tracking Code: BO * EXAM: CT ABDOMEN AND PELVIS WITHOUT CONTRAST TECHNIQUE: Multidetector CT imaging of the abdomen and pelvis was performed following the standard protocol without IV contrast. RADIATION DOSE REDUCTION: This exam was performed according to the departmental dose-optimization program which includes automated exposure control, adjustment of the mA and/or kV according to patient size and/or use of iterative reconstruction technique. COMPARISON:  None FINDINGS: Lower chest: No acute abnormality. Hepatobiliary: No focal liver abnormality is seen. No gallstones, gallbladder wall thickening, or biliary dilatation. Pancreas: Unremarkable. No pancreatic ductal dilatation or surrounding inflammatory changes. Spleen: Normal in size without focal abnormality. Adrenals/Urinary Tract: Normal appearance of the right adrenal gland and right kidney. There is a large, heterogeneous mass involving the upper pole of the left kidney. This measures 8.3 x 9.5 by 7.3 cm,, image 23/3 and image 81/7. The tumor extends into the surrounding left upper quadrant soft tissues abutting the spleen, tail of pancreas and undersurface of the posterior left hemidiaphragm. Soft tissue and fluid components as well as scattered calcifications noted within this mass. There is left-sided hydronephrosis and hydroureter. No obstructing stone identified. Urinary bladder appears normal for degree of distension. Stomach/Bowel: Stomach is normal. The appendix is visualized and is within normal  limits. No pathologic dilatation of the large or small bowel loops. Vascular/Lymphatic: Aortic atherosclerosis. No signs of abdominopelvic adenopathy. Reproductive: Uterus and bilateral adnexa are unremarkable. Other: No free fluid or fluid collections. Musculoskeletal: No acute or suspicious osseous findings. IMPRESSION: 1. There is a large, heterogeneous mass involving the upper pole of the left kidney measuring 8.3 x 9.5 x 7.3 cm. The mass extends into the surrounding left upper quadrant soft tissues abutting the spleen, tail of pancreas and undersurface of the posterior left hemidiaphragm. Findings are worrisome for primary renal cell carcinoma. Advise more definitive characterization with dedicated renal mass protocol CT or MRI. 2. Left-sided hydronephrosis and hydroureter. No obstructing stone identified. 3.  Aortic Atherosclerosis (ICD10-I70.0). Electronically Signed   By: Kimberley Penman M.D.   On: 01/24/2024 11:08    Procedures Procedures    Medications Ordered in ED Medications  HYDROmorphone (DILAUDID) injection 0.5 mg (0.5 mg Intravenous Given 01/24/24 1048)  ondansetron  (ZOFRAN ) injection 4 mg (4 mg Intravenous Given 01/24/24 1047)  HYDROmorphone (DILAUDID) injection 0.5 mg (0.5 mg Intravenous Given 01/24/24 1230)  ondansetron  (ZOFRAN ) injection 4  mg (4 mg Intravenous Given 01/24/24 1231)  iohexol (OMNIPAQUE) 350 MG/ML injection 100 mL (100 mLs Intravenous Contrast Given 01/24/24 1402)  ondansetron  (ZOFRAN ) injection 4 mg (4 mg Intravenous Given 01/24/24 1448)  HYDROmorphone (DILAUDID) injection 1 mg (1 mg Intravenous Given 01/24/24 1448)  sodium chloride  0.9 % bolus 1,000 mL (1,000 mLs Intravenous New Bag/Given 01/24/24 1451)  HYDROmorphone (DILAUDID) injection 1 mg (1 mg Intravenous Given 01/24/24 1552)    ED Course/ Medical Decision Making/ A&P                                 Medical Decision Making Amount and/or Complexity of Data Reviewed Labs: ordered. Radiology:  ordered.  Risk Prescription drug management.   Patient's labs very reassuring liver function test are normal renal functions normal.  Lipase also normal.  White count up a little bit at 12.1 platelets are good at 448.  Urinalysis greater than 50 RBCs with a little bit of white blood cells but bacteria is rare.  Will send urine for culture.  Pregnancy test negative.  Patient had a CT renal scan prior to me seeing her.  That shows evidence of a large left kidney mass measuring 8.3 x 9.5 x 7.3 cm.  Mass extends into the surrounding left upper quadrant soft tissue abutting spleen tail of pancreas and undersurface of the posterior left hemidiaphragm.  Based on this we will get CT chest.  Then we will talk to oncology and get her plugged in for follow-up.  Right now we will treat with antinausea medicine and pain medicine.  Also will send urine for culture.   CT chest CT abdomen pelvis with IV contrast without any new additional findings.  Discussed with Dr. Marton Sleeper on-call for oncology.  Also discussed with urology.  They both will plug her in for follow-up.  Patient given IV fluids here patient's pains under better control discharged with oxycodone 5 mg and Zofran .  Patient will return anything new or worse.   Final Clinical Impression(s) / ED Diagnoses Final diagnoses:  Left renal mass    Rx / DC Orders ED Discharge Orders          Ordered    ondansetron  (ZOFRAN -ODT) 4 MG disintegrating tablet  Every 8 hours PRN        01/24/24 1553    oxyCODONE (ROXICODONE) 5 MG immediate release tablet  Every 6 hours PRN        01/24/24 1553              Uriel Horkey, MD 01/24/24 1150    Dorthia Tout, MD 01/24/24 1555

## 2024-01-24 NOTE — ED Triage Notes (Signed)
 Pt arrives via POV from home with mild abdominal pain yesterday. Today woke up with severe LLQ abdominal pain. Possible blood in her urine. Vomited once. No fevers that she was aware of.

## 2024-01-24 NOTE — ED Notes (Signed)
 Got patient on the monitor did EKG shown to Dr Zackowski patient is resting with family at bedside and call bell in reach

## 2024-01-24 NOTE — ED Notes (Signed)
 Pt taken to CT.

## 2024-01-24 NOTE — Discharge Instructions (Addendum)
 Expect calls from [facility] for follow-up on Monday or Tuesday.  Also expect a call from [facility].  If you do not hear from them information is provided above for you to contact them Monday or Tuesday.  Take your oxycodone as directed take the Zofran  for the nausea and vomiting as directed.  Just make sure you are hydrating yourself well.  If you do need to return would recommend that maybe you would go to [facility] because admission there would be more appropriate.  Hopefully you will do okay.

## 2024-01-24 NOTE — ED Provider Triage Note (Signed)
 Emergency Medicine Provider Triage Evaluation Note  Tammy Hendrix , a 53 y.o. female  was evaluated in triage.  Pt complains of flank pain and abdominal pain.  Pt thinks she has a kidney stone  Review of Systems  Positive: Back and abdominal pain Negative: fever  Physical Exam  BP (!) 168/83 (BP Location: Right Arm)   Pulse 72   Temp 98.1 F (36.7 C) (Oral)   Resp 20   SpO2 100%  Gen:   Awake, no distress   Resp:  Normal effort  MSK:   Moves extremities without difficulty  Other:    Medical Decision Making  Medically screening exam initiated at 9:35 AM.  Appropriate orders placed.  Tammy Hendrix was informed that the remainder of the evaluation will be completed by another provider, this initial triage assessment does not replace that evaluation, and the importance of remaining in the ED until their evaluation is complete.     Tammy Crosby, PA-C 01/24/24 1191

## 2024-01-25 LAB — URINE CULTURE: Culture: <10000 — AB

## 2024-01-26 ENCOUNTER — Ambulatory Visit

## 2024-01-26 ENCOUNTER — Other Ambulatory Visit

## 2024-01-27 ENCOUNTER — Inpatient Hospital Stay

## 2024-01-27 VITALS — BP 179/92 | HR 75 | Temp 97.9°F | Resp 16 | Ht 62.5 in | Wt 127.1 lb

## 2024-01-27 DIAGNOSIS — N390 Urinary tract infection, site not specified: Secondary | ICD-10-CM | POA: Diagnosis not present

## 2024-01-27 DIAGNOSIS — D649 Anemia, unspecified: Secondary | ICD-10-CM | POA: Diagnosis not present

## 2024-01-27 DIAGNOSIS — R319 Hematuria, unspecified: Secondary | ICD-10-CM

## 2024-01-27 DIAGNOSIS — C642 Malignant neoplasm of left kidney, except renal pelvis: Secondary | ICD-10-CM | POA: Diagnosis not present

## 2024-01-27 DIAGNOSIS — D75838 Other thrombocytosis: Secondary | ICD-10-CM | POA: Insufficient documentation

## 2024-01-27 DIAGNOSIS — N2889 Other specified disorders of kidney and ureter: Secondary | ICD-10-CM | POA: Diagnosis not present

## 2024-01-27 DIAGNOSIS — R112 Nausea with vomiting, unspecified: Secondary | ICD-10-CM

## 2024-01-27 LAB — IRON AND IRON BINDING CAPACITY (CC-WL,HP ONLY)
Iron: 53 ug/dL (ref 28–170)
Saturation Ratios: 13 % (ref 10.4–31.8)
TIBC: 400 ug/dL (ref 250–450)
UIBC: 347 ug/dL (ref 148–442)

## 2024-01-27 LAB — FERRITIN: Ferritin: 69 ng/mL (ref 11–307)

## 2024-01-27 MED ORDER — PROCHLORPERAZINE MALEATE 10 MG PO TABS: 10.0000 mg | ORAL_TABLET | Freq: Three times a day (TID) | ORAL | 0 refills | Status: AC | PRN

## 2024-01-27 MED ORDER — CIPROFLOXACIN HCL 500 MG PO TABS: 500.0000 mg | ORAL_TABLET | Freq: Two times a day (BID) | ORAL | 0 refills | Status: DC

## 2024-01-27 NOTE — Assessment & Plan Note (Addendum)
Cipro 500 mg twice daily for 3 days. 

## 2024-01-27 NOTE — Assessment & Plan Note (Addendum)
 Likely from renal mass with mild leukocytosis and thrombocytosis. Will repeat in the future follow up visits.

## 2024-01-27 NOTE — Progress Notes (Signed)
 Tammy Hendrix CONSULT NOTE  Patient Care Team: Baxley, Jaynie Meyers, MD as PCP - General (Internal Medicine)  ASSESSMENT & PLAN:  Tammy Hendrix is a 53 y.o.female with unremarkable history [facility] for left renal mass.  New kidney mass suggesting kidney cancer.  CT showed large 9.9 x 6.8 x 8.1 cm left renal mass. No vascular invasion reported.   Discussed with patient.  If resectable should proceed with surgery first follow by final pathology to determine if additional treatment is needed. No distant metastases found. She has no neurologic symptoms. Will follow up after surgery.  Assessment & Plan Left renal mass MRI of brain Urology referral Will present at Tumor board. Follow up after surgery. Reactive thrombocytosis Likely from renal mass with mild leukocytosis and thrombocytosis. Will repeat in the future follow up visits. Hematuria, unspecified type Ferritin and iron today Acute UTI Cipro  500 mg twice daily for 3 days Nausea and vomiting, unspecified vomiting type Compazine sent to her pharmacy  Orders Placed This Encounter  Procedures   Culture, Urine    Standing Status:   Future    Number of Occurrences:   1    Expiration Date:   01/26/2025   Iron and Iron Binding Capacity ([facility] only)    Standing Status:   Future    Number of Occurrences:   1    Expiration Date:   01/26/2025   Ferritin    Standing Status:   Future    Number of Occurrences:   1    Expiration Date:   01/26/2025   All questions were answered. The patient knows to call the clinic with any problems, questions or concerns. No barriers to learning was detected.  Lowanda Ruddy, MD 5/6/20252:23 PM  CHIEF COMPLAINTS/PURPOSE OF CONSULTATION:  Renal mass  HISTORY OF PRESENTING ILLNESS:  Tammy Hendrix 53 y.o. female is here because of kidney mass.  She thought she had kidney stone and thought passed one. The pain was worse and resulted in ED presentation. Over the last  few days she started having hematuria, urgency, increased urinary frequency with some pain.  01/24/24 CT AP and chest 1. Heterogeneous mass with cystic and solid regions and calcifications in the mid to upper left kidney measuring 9.9 x 6.8 x 8.1 cm, concerning for renal cell carcinoma. There is moderate hydronephrosis involving the lower pole of the left kidney without evidence of obstructing lesion. There is no definite evidence renal vein thrombosis or lymphadenopathy.  She was previously healthy without any limitations.  MEDICAL HISTORY:  Past Medical History:  Diagnosis Date   Allergy    SEASONAL   Anemia    AFTER 3RD BABY   Asthma    Dermatitis    Heart murmur    TOLD AS A CHILD HAD MURMUR BUT NO ONE SAID SINCE    SURGICAL HISTORY: Past Surgical History:  Procedure Laterality Date   VAGINAL DELIVERY  2001, 2004, 2007   WISDOM TOOTH EXTRACTION     AGE 24 OR 20    SOCIAL HISTORY: Social History   Socioeconomic History   Marital status: Married    Spouse name: Not on file   Number of children: Not on file   Years of education: Not on file   Highest education level: Not on file  Occupational History   Not on file  Tobacco Use   Smoking status: Never   Smokeless tobacco: Never  Vaping Use   Vaping status: Never Used  Substance and Sexual Activity   Alcohol use: Yes    Comment: occas   Drug use: No   Sexual activity: Yes  Other Topics Concern   Not on file  Social History Narrative   Not on file   Social Drivers of Health   Financial Resource Strain: Not on file  Food Insecurity: Not on file  Transportation Needs: Not on file  Physical Activity: Not on file  Stress: Not on file  Social Connections: Not on file  Intimate Partner Violence: Not on file    FAMILY HISTORY: Family History  Problem Relation Age of Onset   Hyperlipidemia Mother    Thyroid disease Mother    Thyroid cancer Mother 4   Hyperlipidemia Father    Bipolar disorder Father     Neuropathy Father    Colon cancer Neg Hx    Colon polyps Neg Hx    Esophageal cancer Neg Hx    Stomach cancer Neg Hx    Rectal cancer Neg Hx     ALLERGIES:  is allergic to peanuts [peanut oil], shellfish allergy, and bacitracin-polymyxin b.  MEDICATIONS:  Current Outpatient Medications  Medication Sig Dispense Refill   ciprofloxacin  (CIPRO ) 500 MG tablet Take 1 tablet (500 mg total) by mouth 2 (two) times daily. 6 tablet 0   prochlorperazine (COMPAZINE) 10 MG tablet Take 1 tablet (10 mg total) by mouth every 8 (eight) hours as needed for nausea or vomiting. 30 tablet 0   albuterol  (VENTOLIN  HFA) 108 (90 Base) MCG/ACT inhaler Inhale 2 puffs into the lungs every 6 (six) hours as needed for shortness of breath.     budesonide-formoterol (SYMBICORT) 80-4.5 MCG/ACT inhaler SMARTSIG:By Mouth     cetirizine (ZYRTEC) 10 MG tablet Take 10 mg by mouth daily.     clindamycin (CLEOCIN T) 1 % lotion Apply 1 as directed to affected area once a day     Cyanocobalamin  (VITAMIN B12 PO) Vitamin B12 500 mcg tablet   1000 micrograms every day by oral route.     desonide (DESOWEN) 0.05 % cream SMARTSIG:sparingly Topical Twice Daily     ibuprofen  (ADVIL ) 100 MG tablet Take 100 mg by mouth every 6 (six) hours as needed for fever.     Norethin-Eth Estradiol-Fe [facility] FE,WYMZYA Fayne Hoover) 0.4-35 MG-MCG tablet Chew 1 tablet by mouth daily.     Olopatadine HCl (PATADAY) 0.2 % SOLN 1 drop into affected eye     ondansetron  (ZOFRAN -ODT) 4 MG disintegrating tablet Take 1 tablet (4 mg total) by mouth every 8 (eight) hours as needed for nausea or vomiting. 20 tablet 0   oxyCODONE (ROXICODONE) 5 MG immediate release tablet Take 1 tablet (5 mg total) by mouth every 6 (six) hours as needed for severe pain (pain score 7-10) or moderate pain (pain score 4-6). 20 tablet 0   Prasterone (INTRAROSA) 6.5 MG INST Place vaginally. 2-3 X A WEEK     No current facility-administered medications for this visit.    REVIEW  OF SYSTEMS:   All relevant systems were reviewed with the patient and are negative.  PHYSICAL EXAMINATION: ECOG PERFORMANCE STATUS: 0 - Asymptomatic  Vitals:   01/27/24 1145 01/27/24 1146  BP: (!) 178/93 (!) 179/92  Pulse: 75   Resp: 16   Temp: 97.9 F (36.6 C)   SpO2: 100%    Filed Weights   01/27/24 1145  Weight: 127 lb 1.6 oz (57.7 kg)    GENERAL: alert, no distress and comfortable SKIN: skin color is normal, no  jaundice, rashes or significant lesions EYES: sclera clear OROPHARYNX: no exudate, no erythema NECK: supple LYMPH:  no palpable lymphadenopathy in the cervical regions LUNGS: Effort normal, no respiratory distress.  Clear to auscultation bilaterally HEART: regular rate & rhythm and no lower extremity edema ABDOMEN: soft, non-tender and nondistended NEURO: no focal motor/sensory deficits  LABORATORY DATA:  I have reviewed the data as listed Lab Results  Component Value Date   WBC 12.1 (H) 01/24/2024   HGB 13.1 01/24/2024   HCT 40.0 01/24/2024   MCV 99.3 01/24/2024   PLT 448 (H) 01/24/2024   Recent Labs    04/28/23 0911 01/24/24 0813  NA 138 137  K 5.0 3.6  CL 104 105  CO2 28 22  GLUCOSE 83 112*  BUN 12 14  CREATININE 0.80 1.06*  CALCIUM 9.3 9.8  GFRNONAA  --  >60  PROT 6.8 7.2  ALBUMIN  --  3.0*  AST 14 17  ALT 8 14  ALKPHOS  --  71  BILITOT 0.7 0.7    RADIOGRAPHIC STUDIES: I have personally reviewed the radiological images as listed and agreed with the findings in the report. CT CHEST W CONTRAST Result Date: 01/24/2024 CLINICAL DATA:  Large left renal mass. EXAM: CT CHEST, ABDOMEN, AND PELVIS WITH CONTRAST TECHNIQUE: Multidetector CT imaging of the chest, abdomen and pelvis was performed following the standard protocol during bolus administration of intravenous contrast. RADIATION DOSE REDUCTION: This exam was performed according to the departmental dose-optimization program which includes automated exposure control, adjustment of the mA  and/or kV according to patient size and/or use of iterative reconstruction technique. CONTRAST:  OMNIPAQUE IOHEXOL 350 MG/ML SOLN COMPARISON:  01/24/2024. FINDINGS: CT CHEST FINDINGS Cardiovascular: The heart is normal in size and there is no pericardial effusion. The aorta and pulmonary trunk are normal in caliber. Mediastinum/Nodes: No mediastinal, hilar, or axillary lymphadenopathy. Subcentimeter hypodensities are present in the left lobe of the thyroid gland. No additional imaging is recommended. The trachea and esophagus are within normal limits. Lungs/Pleura: There is a trace right pleural effusion. Atelectasis is noted bilaterally. No consolidation or pneumothorax is seen. No pulmonary nodule or mass is seen. Musculoskeletal: No acute or suspicious osseous abnormality is seen. CT ABDOMEN PELVIS FINDINGS Hepatobiliary: No focal liver abnormality is seen. No gallstones, gallbladder wall thickening, or biliary dilatation. Pancreas: Unremarkable. No pancreatic ductal dilatation or surrounding inflammatory changes. Spleen: Normal in size without focal abnormality. Adrenals/Urinary Tract: The right adrenal gland is within normal limits. The left adrenal gland is not well seen. There is a heterogeneous cystic and solid mass in the mid to upper left kidney measuring 9.9 x 6.8 x 8.1 cm containing a few calcifications. The tumor abuts of the spleen and tail of the pancreas. There is mild-to-moderate hydronephrosis involving in the lower pole of the left kidney without evidence obstructing lesion. No renal calculus or obstructive uropathy on the right. The bladder is unremarkable. No obvious renal vein thrombosis is seen. Multiple varices are noted in the perirenal space on the left. Stomach/Bowel: Stomach is within normal limits. Appendix appears normal. No evidence of bowel wall thickening, distention, or inflammatory changes. No free air or pneumatosis is seen. Vascular/Lymphatic: Aortic atherosclerosis. No  enlarged abdominal or pelvic lymph nodes. Reproductive: Uterus and bilateral adnexa are unremarkable. Other: Left perinephric edema and a small amount of free fluid is noted. Musculoskeletal: No acute or suspicious osseous abnormality. IMPRESSION: 1. Heterogeneous mass with cystic and solid regions and calcifications in the mid to upper left  kidney measuring 9.9 x 6.8 x 8.1 cm, concerning for renal cell carcinoma. There is moderate hydronephrosis involving the lower pole of the left kidney without evidence of obstructing lesion. There is no definite evidence renal vein thrombosis or lymphadenopathy. 2. Trace right pleural effusion with atelectasis at the lung bases. 3. Aortic atherosclerosis. Electronically Signed   By: Wyvonnia Heimlich M.D.   On: 01/24/2024 14:44   CT ABDOMEN PELVIS W CONTRAST Result Date: 01/24/2024 CLINICAL DATA:  Large left renal mass. EXAM: CT CHEST, ABDOMEN, AND PELVIS WITH CONTRAST TECHNIQUE: Multidetector CT imaging of the chest, abdomen and pelvis was performed following the standard protocol during bolus administration of intravenous contrast. RADIATION DOSE REDUCTION: This exam was performed according to the departmental dose-optimization program which includes automated exposure control, adjustment of the mA and/or kV according to patient size and/or use of iterative reconstruction technique. CONTRAST:  OMNIPAQUE IOHEXOL 350 MG/ML SOLN COMPARISON:  01/24/2024. FINDINGS: CT CHEST FINDINGS Cardiovascular: The heart is normal in size and there is no pericardial effusion. The aorta and pulmonary trunk are normal in caliber. Mediastinum/Nodes: No mediastinal, hilar, or axillary lymphadenopathy. Subcentimeter hypodensities are present in the left lobe of the thyroid gland. No additional imaging is recommended. The trachea and esophagus are within normal limits. Lungs/Pleura: There is a trace right pleural effusion. Atelectasis is noted bilaterally. No consolidation or pneumothorax is  seen. No pulmonary nodule or mass is seen. Musculoskeletal: No acute or suspicious osseous abnormality is seen. CT ABDOMEN PELVIS FINDINGS Hepatobiliary: No focal liver abnormality is seen. No gallstones, gallbladder wall thickening, or biliary dilatation. Pancreas: Unremarkable. No pancreatic ductal dilatation or surrounding inflammatory changes. Spleen: Normal in size without focal abnormality. Adrenals/Urinary Tract: The right adrenal gland is within normal limits. The left adrenal gland is not well seen. There is a heterogeneous cystic and solid mass in the mid to upper left kidney measuring 9.9 x 6.8 x 8.1 cm containing a few calcifications. The tumor abuts of the spleen and tail of the pancreas. There is mild-to-moderate hydronephrosis involving in the lower pole of the left kidney without evidence obstructing lesion. No renal calculus or obstructive uropathy on the right. The bladder is unremarkable. No obvious renal vein thrombosis is seen. Multiple varices are noted in the perirenal space on the left. Stomach/Bowel: Stomach is within normal limits. Appendix appears normal. No evidence of bowel wall thickening, distention, or inflammatory changes. No free air or pneumatosis is seen. Vascular/Lymphatic: Aortic atherosclerosis. No enlarged abdominal or pelvic lymph nodes. Reproductive: Uterus and bilateral adnexa are unremarkable. Other: Left perinephric edema and a small amount of free fluid is noted. Musculoskeletal: No acute or suspicious osseous abnormality. IMPRESSION: 1. Heterogeneous mass with cystic and solid regions and calcifications in the mid to upper left kidney measuring 9.9 x 6.8 x 8.1 cm, concerning for renal cell carcinoma. There is moderate hydronephrosis involving the lower pole of the left kidney without evidence of obstructing lesion. There is no definite evidence renal vein thrombosis or lymphadenopathy. 2. Trace right pleural effusion with atelectasis at the lung bases. 3. Aortic  atherosclerosis. Electronically Signed   By: Wyvonnia Heimlich M.D.   On: 01/24/2024 14:44   CT Renal Stone Study Result Date: 01/24/2024 CLINICAL DATA:  Abdominal pain/flank pain. * Tracking Code: BO * EXAM: CT ABDOMEN AND PELVIS WITHOUT CONTRAST TECHNIQUE: Multidetector CT imaging of the abdomen and pelvis was performed following the standard protocol without IV contrast. RADIATION DOSE REDUCTION: This exam was performed according to the departmental dose-optimization program which  includes automated exposure control, adjustment of the mA and/or kV according to patient size and/or use of iterative reconstruction technique. COMPARISON:  None FINDINGS: Lower chest: No acute abnormality. Hepatobiliary: No focal liver abnormality is seen. No gallstones, gallbladder wall thickening, or biliary dilatation. Pancreas: Unremarkable. No pancreatic ductal dilatation or surrounding inflammatory changes. Spleen: Normal in size without focal abnormality. Adrenals/Urinary Tract: Normal appearance of the right adrenal gland and right kidney. There is a large, heterogeneous mass involving the upper pole of the left kidney. This measures 8.3 x 9.5 by 7.3 cm,, image 23/3 and image 81/7. The tumor extends into the surrounding left upper quadrant soft tissues abutting the spleen, tail of pancreas and undersurface of the posterior left hemidiaphragm. Soft tissue and fluid components as well as scattered calcifications noted within this mass. There is left-sided hydronephrosis and hydroureter. No obstructing stone identified. Urinary bladder appears normal for degree of distension. Stomach/Bowel: Stomach is normal. The appendix is visualized and is within normal limits. No pathologic dilatation of the large or small bowel loops. Vascular/Lymphatic: Aortic atherosclerosis. No signs of abdominopelvic adenopathy. Reproductive: Uterus and bilateral adnexa are unremarkable. Other: No free fluid or fluid collections. Musculoskeletal: No acute  or suspicious osseous findings. IMPRESSION: 1. There is a large, heterogeneous mass involving the upper pole of the left kidney measuring 8.3 x 9.5 x 7.3 cm. The mass extends into the surrounding left upper quadrant soft tissues abutting the spleen, tail of pancreas and undersurface of the posterior left hemidiaphragm. Findings are worrisome for primary renal cell carcinoma. Advise more definitive characterization with dedicated renal mass protocol CT or MRI. 2. Left-sided hydronephrosis and hydroureter. No obstructing stone identified. 3.  Aortic Atherosclerosis (ICD10-I70.0). Electronically Signed   By: Kimberley Penman M.D.   On: 01/24/2024 11:08

## 2024-01-27 NOTE — Assessment & Plan Note (Addendum)
 MRI of brain Urology referral Will present at Tumor board. Follow up after surgery.

## 2024-01-28 LAB — URINE CULTURE

## 2024-01-29 ENCOUNTER — Other Ambulatory Visit: Payer: Self-pay | Admitting: Urology

## 2024-02-02 ENCOUNTER — Telehealth: Payer: Self-pay

## 2024-02-02 NOTE — Telephone Encounter (Signed)
 Patient scheduled appointments. Patient is aware of all appointment details.

## 2024-02-06 ENCOUNTER — Telehealth: Payer: Self-pay | Admitting: Internal Medicine

## 2024-02-06 ENCOUNTER — Encounter: Payer: Self-pay | Admitting: Internal Medicine

## 2024-02-06 DIAGNOSIS — F409 Phobic anxiety disorder, unspecified: Secondary | ICD-10-CM

## 2024-02-06 MED ORDER — ALPRAZOLAM 0.5 MG PO TABS: 0.5000 mg | ORAL_TABLET | Freq: Every evening | ORAL | 0 refills | Status: AC | PRN

## 2024-02-06 NOTE — Telephone Encounter (Signed)
 Spoke with patient by phone today. Having insomnia due to anxiety with upcoming surgery in 2 weeks. Discussed options. I am sending in Xanax 0.5 mg tablets to take  one half hour before bedtime. Could take up to twice daily if needed for anxiety related to upcoming surgery if necessary. Expect dose to last about 4-6 hours. MJB, MD

## 2024-02-12 NOTE — Patient Instructions (Signed)
 SURGICAL WAITING ROOM VISITATION  Patients having surgery or a procedure may have no more than 2 support people in the waiting area - these visitors may rotate.    Children under the age of 75 must have an adult with them who is not the patient.  Due to an increase in RSV and influenza rates and associated hospitalizations, children ages 10 and under may not visit patients in [facility] hospitals.  Visitors with respiratory illnesses are discouraged from visiting and should remain at home.  If the patient needs to stay at the hospital during part of their recovery, the visitor guidelines for inpatient rooms apply. Pre-op nurse will coordinate an appropriate time for 1 support person to accompany patient in pre-op.  This support person may not rotate.    Please refer to the [facility] website for the visitor guidelines for Inpatients (after your surgery is over and you are in a regular room).       Your procedure is scheduled on: 02/19/24   Report to [facility] Main Entrance    Report to admitting at : 10:15 AM   Call this number if you have problems the morning of surgery [phone]   Clear liquids starting the day before surgery until : 9:30 AM DAY OF SURGERY  Water Non-Citrus Juices (without pulp, NO RED-Apple, White grape, White cranberry) Black Coffee (NO MILK/CREAM OR CREAMERS, sugar ok)  Clear Tea (NO MILK/CREAM OR CREAMERS, sugar ok) regular and decaf                             Plain Jell-O (NO RED)                                           Fruit ices (not with fruit pulp, NO RED)                                     Popsicles (NO RED)                                                               Sports drinks like Gatorade (NO RED)              FOLLOW BOWEL PREP AND ANY ADDITIONAL PRE OP INSTRUCTIONS YOU RECEIVED FROM YOUR SURGEON'S OFFICE!!!   Oral Hygiene is also important to reduce your risk of infection.                                    Remember -  BRUSH YOUR TEETH THE MORNING OF SURGERY WITH YOUR REGULAR TOOTHPASTE  DENTURES WILL BE REMOVED PRIOR TO SURGERY PLEASE DO NOT APPLY "Poly grip" OR ADHESIVES!!!   Do NOT smoke after Midnight   Stop all vitamins and herbal supplements 7 days before surgery.   Take these medicines the morning of surgery with A SIP OF WATER: cetirizine.Use inhalers as usual and bring them.  You may not have any metal on your body including hair pins, jewelry, and body piercing             Do not wear make-up, lotions, powders, perfumes/cologne, or deodorant  Do not wear nail polish including gel and S&S, artificial/acrylic nails, or any other type of covering on natural nails including finger and toenails. If you have artificial nails, gel coating, etc. that needs to be removed by a nail salon please have this removed prior to surgery or surgery may need to be canceled/ delayed if the surgeon/ anesthesia feels like they are unable to be safely monitored.   Do not shave  48 hours prior to surgery.    Do not bring valuables to the hospital. [facility] IS NOT             RESPONSIBLE   FOR VALUABLES.   Contacts, glasses, dentures or bridgework may not be worn into surgery.   Bring small overnight bag day of surgery.   DO NOT BRING YOUR HOME MEDICATIONS TO THE HOSPITAL. PHARMACY WILL DISPENSE MEDICATIONS LISTED ON YOUR MEDICATION LIST TO YOU DURING YOUR ADMISSION IN THE HOSPITAL!    Patients discharged on the day of surgery will not be allowed to drive home.  Someone NEEDS to stay with you for the first 24 hours after anesthesia.   Special Instructions: Bring a copy of your healthcare power of attorney and living will documents the day of surgery if you haven't scanned them before.              Please read over the following fact sheets you were given: IF YOU HAVE QUESTIONS ABOUT YOUR PRE-OP INSTRUCTIONS PLEASE CALL [phone]   If you received a COVID test during your pre-op  visit  it is requested that you wear a mask when out in public, stay away from anyone that may not be feeling well and notify your surgeon if you develop symptoms. If you test positive for Covid or have been in contact with anyone that has tested positive in the last 10 days please notify you surgeon.    [facility] - Preparing for Surgery Before surgery, you can play an important role.  Because skin is not sterile, your skin needs to be as free of germs as possible.  You can reduce the number of germs on your skin by washing with CHG (chlorahexidine gluconate) soap before surgery.  CHG is an antiseptic cleaner which kills germs and bonds with the skin to continue killing germs even after washing. Please DO NOT use if you have an allergy to CHG or antibacterial soaps.  If your skin becomes reddened/irritated stop using the CHG and inform your nurse when you arrive at Short Stay. Do not shave (including legs and underarms) for at least 48 hours prior to the first CHG shower.  You may shave your face/neck. Please follow these instructions carefully:  1.  Shower with CHG Soap the night before surgery and the  morning of Surgery.  2.  If you choose to wash your hair, wash your hair first as usual with your  normal  shampoo.  3.  After you shampoo, rinse your hair and body thoroughly to remove the  shampoo.                           4.  Use CHG as you would any other liquid soap.  You can apply chg directly  to  the skin and wash                       Gently with a scrungie or clean washcloth.  5.  Apply the CHG Soap to your body ONLY FROM THE NECK DOWN.   Do not use on face/ open                           Wound or open sores. Avoid contact with eyes, ears mouth and genitals (private parts).                       Wash face,  Genitals (private parts) with your normal soap.             6.  Wash thoroughly, paying special attention to the area where your surgery  will be performed.  7.  Thoroughly rinse your  body with warm water from the neck down.  8.  DO NOT shower/wash with your normal soap after using and rinsing off  the CHG Soap.                9.  Pat yourself dry with a clean towel.            10.  Wear clean pajamas.            11.  Place clean sheets on your bed the night of your first shower and do not  sleep with pets. Day of Surgery : Do not apply any lotions/deodorants the morning of surgery.  Please wear clean clothes to the hospital/surgery center.  FAILURE TO FOLLOW THESE INSTRUCTIONS MAY RESULT IN THE CANCELLATION OF YOUR SURGERY PATIENT SIGNATURE_________________________________  NURSE SIGNATURE__________________________________  ________________________________________________________________________ WHAT IS A BLOOD TRANSFUSION? Blood Transfusion Information  A transfusion is the replacement of blood or some of its parts. Blood is made up of multiple cells which provide different functions. Red blood cells carry oxygen and are used for blood loss replacement. White blood cells fight against infection. Platelets control bleeding. Plasma helps clot blood. Other blood products are available for specialized needs, such as hemophilia or other clotting disorders. BEFORE THE TRANSFUSION  Who gives blood for transfusions?  Healthy volunteers who are fully evaluated to make sure their blood is safe. This is blood bank blood. Transfusion therapy is the safest it has ever been in the practice of medicine. Before blood is taken from a donor, a complete history is taken to make sure that person has no history of diseases nor engages in risky social behavior (examples are intravenous drug use or sexual activity with multiple partners). The donor's travel history is screened to minimize risk of transmitting infections, such as malaria. The donated blood is tested for signs of infectious diseases, such as HIV and hepatitis. The blood is then tested to be sure it is compatible with you in  order to minimize the chance of a transfusion reaction. If you or a relative donates blood, this is often done in anticipation of surgery and is not appropriate for emergency situations. It takes many days to process the donated blood. RISKS AND COMPLICATIONS Although transfusion therapy is very safe and saves many lives, the main dangers of transfusion include:  Getting an infectious disease. Developing a transfusion reaction. This is an allergic reaction to something in the blood you were given. Every precaution is taken to prevent this. The decision to have a blood transfusion has  been considered carefully by your caregiver before blood is given. Blood is not given unless the benefits outweigh the risks. AFTER THE TRANSFUSION Right after receiving a blood transfusion, you will usually feel much better and more energetic. This is especially true if your red blood cells have gotten low (anemic). The transfusion raises the level of the red blood cells which carry oxygen, and this usually causes an energy increase. The nurse administering the transfusion will monitor you carefully for complications. HOME CARE INSTRUCTIONS  No special instructions are needed after a transfusion. You may find your energy is better. Speak with your caregiver about any limitations on activity for underlying diseases you may have. SEEK MEDICAL CARE IF:  Your condition is not improving after your transfusion. You develop redness or irritation at the intravenous (IV) site. SEEK IMMEDIATE MEDICAL CARE IF:  Any of the following symptoms occur over the next 12 hours: Shaking chills. You have a temperature by mouth above 102 F (38.9 C), not controlled by medicine. Chest, back, or muscle pain. People around you feel you are not acting correctly or are confused. Shortness of breath or difficulty breathing. Dizziness and fainting. You get a rash or develop hives. You have a decrease in urine output. Your urine turns a  dark color or changes to pink, red, or brown. Any of the following symptoms occur over the next 10 days: You have a temperature by mouth above 102 F (38.9 C), not controlled by medicine. Shortness of breath. Weakness after normal activity. The white part of the eye turns yellow (jaundice). You have a decrease in the amount of urine or are urinating less often. Your urine turns a dark color or changes to pink, red, or brown. Document Released: 09/06/2000 Document Revised: 12/02/2011 Document Reviewed: 04/25/2008 [facility] Patient Information 2014 [place], [place].  _______________________________________________________________________

## 2024-02-13 ENCOUNTER — Encounter (HOSPITAL_COMMUNITY): Payer: Self-pay

## 2024-02-13 ENCOUNTER — Other Ambulatory Visit: Payer: Self-pay

## 2024-02-13 ENCOUNTER — Encounter (HOSPITAL_COMMUNITY)
Admission: RE | Admit: 2024-02-13 | Discharge: 2024-02-13 | Disposition: A | Discharge status: Home / Self Care | Source: Ambulatory Visit | Attending: Urology | Admitting: Urology

## 2024-02-13 VITALS — BP 127/81 | HR 85 | Temp 97.9°F | Ht 62.5 in | Wt 121.0 lb

## 2024-02-13 DIAGNOSIS — Z01812 Encounter for preprocedural laboratory examination: Secondary | ICD-10-CM | POA: Diagnosis not present

## 2024-02-13 DIAGNOSIS — Z01818 Encounter for other preprocedural examination: Secondary | ICD-10-CM

## 2024-02-13 DIAGNOSIS — R011 Cardiac murmur, unspecified: Secondary | ICD-10-CM | POA: Diagnosis not present

## 2024-02-13 HISTORY — DX: Depression, unspecified: F32.A

## 2024-02-13 LAB — CBC
HCT: 37.5 % (ref 36.0–46.0)
Hemoglobin: 12.2 g/dL (ref 12.0–15.0)
MCH: 32.6 pg (ref 26.0–34.0)
MCHC: 32.5 g/dL (ref 30.0–36.0)
MCV: 100.3 fL — ABNORMAL HIGH (ref 80.0–100.0)
Platelets: 750 10*3/uL — ABNORMAL HIGH (ref 150–400)
RBC: 3.74 MIL/uL — ABNORMAL LOW (ref 3.87–5.11)
RDW: 12.4 % (ref 11.5–15.5)
WBC: 11 10*3/uL — ABNORMAL HIGH (ref 4.0–10.5)
nRBC: 0 % (ref 0.0–0.2)

## 2024-02-13 LAB — BASIC METABOLIC PANEL WITH GFR
Anion gap: 8 (ref 5–15)
BUN: 15 mg/dL (ref 6–20)
CO2: 23 mmol/L (ref 22–32)
Calcium: 8.9 mg/dL (ref 8.9–10.3)
Chloride: 104 mmol/L (ref 98–111)
Creatinine, Ser: 0.94 mg/dL (ref 0.44–1.00)
GFR, Estimated: >60 mL/min (ref >60)
Glucose, Bld: 81 mg/dL (ref 70–99)
Potassium: 4 mmol/L (ref 3.5–5.1)
Sodium: 135 mmol/L (ref 135–145)

## 2024-02-13 NOTE — Progress Notes (Addendum)
 For Anesthesia: PCP - Sylvan Evener, MD  Cardiologist - N/A  Bowel Prep reminder:  Chest x-ray - CT Chest: 01/24/24 EKG - 01/26/24 Stress Test -  ECHO -  Cardiac Cath -  Pacemaker/ICD device last checked: Pacemaker orders received: Device Rep notified:  Spinal Cord Stimulator:N/A  Sleep Study - N/A CPAP -   Fasting Blood Sugar - N/A Checks Blood Sugar _____ times a day Date and result of last Hgb A1c-  Last dose of GLP1 agonist- N/A GLP1 instructions:   Last dose of SGLT-2 inhibitors- N/A SGLT-2 instructions:   Blood Thinner Instructions:N/A Aspirin Instructions: Last Dose:  Activity level: Can go up a flight of stairs and activities of daily living without stopping and without chest pain and/or shortness of breath   Able to exercise without chest pain and/or shortness of breath  Anesthesia review: Hx: Murmur. Tammy Hendrix was informed.  Patient denies shortness of breath, fever, cough and chest pain at PAT appointment   Patient verbalized understanding of instructions that were given to them at the PAT appointment. Patient was also instructed that they will need to review over the PAT instructions again at home before surgery.

## 2024-02-13 NOTE — Progress Notes (Signed)
 Lab results: platelets: 750

## 2024-02-18 NOTE — Anesthesia Preprocedure Evaluation (Signed)
 Anesthesia Evaluation  Patient identified by MRN, date of birth, ID band Patient awake    Reviewed: Allergy & Precautions, NPO status , Patient's Chart, lab work & pertinent test results  History of Anesthesia Complications Negative for: history of anesthetic complications  Airway Mallampati: II  TM Distance: >3 FB Neck ROM: Full    Dental no notable dental hx. (+) Teeth Intact, Dental Advisory Given   Pulmonary asthma    Pulmonary exam normal breath sounds clear to auscultation       Cardiovascular Exercise Tolerance: Good (-) hypertension(-) angina (-) Past MI Normal cardiovascular exam Rhythm:Regular Rate:Normal     Neuro/Psych negative neurological ROS  negative psych ROS   GI/Hepatic negative GI ROS, Neg liver ROS,,,  Endo/Other  negative endocrine ROS    Renal/GU Lab Results      Component                Value               Date                          K                        4.0                 02/13/2024                    CREATININE               0.94                02/13/2024                              L renal Mass    Musculoskeletal negative musculoskeletal ROS (+)    Abdominal   Peds  Hematology  (+) Blood dyscrasia (reactive thrombocytosis) Lab Results      Component                Value               Date                      WBC                      11.0 (H)            02/13/2024                HGB                      12.2                02/13/2024                HCT                      37.5                02/13/2024                MCV                      100.3 (H)  02/13/2024                PLT                      750 (H)             02/13/2024              Anesthesia Other Findings All: Bacitracin  Reproductive/Obstetrics negative OB ROS                             Anesthesia Physical Anesthesia Plan  ASA: 2  Anesthesia Plan: General    Post-op Pain Management: Ketamine IV* and Tylenol  PO (pre-op)*   Induction: Intravenous  PONV Risk Score and Plan: Treatment may vary due to age or medical condition, Midazolam, Dexamethasone, Ondansetron  and Scopolamine patch - Pre-op  Airway Management Planned: Oral ETT  Additional Equipment: None  Intra-op Plan:   Post-operative Plan: Extubation in OR  Informed Consent: I have reviewed the patients History and Physical, chart, labs and discussed the procedure including the risks, benefits and alternatives for the proposed anesthesia with the patient or authorized representative who has indicated his/her understanding and acceptance.     Dental advisory given  Plan Discussed with: CRNA and Surgeon  Anesthesia Plan Comments:        Anesthesia Quick Evaluation

## 2024-02-19 ENCOUNTER — Inpatient Hospital Stay (HOSPITAL_COMMUNITY): Payer: Self-pay | Admitting: Anesthesiology

## 2024-02-19 ENCOUNTER — Other Ambulatory Visit: Payer: Self-pay

## 2024-02-19 ENCOUNTER — Inpatient Hospital Stay (HOSPITAL_COMMUNITY)
Admission: RE | Admit: 2024-02-19 | Discharge: 2024-02-21 | DRG: 658 | Disposition: A | Discharge status: Home / Self Care | Attending: Urology | Admitting: Urology

## 2024-02-19 ENCOUNTER — Encounter (HOSPITAL_COMMUNITY): Payer: Self-pay | Admitting: Urology

## 2024-02-19 ENCOUNTER — Encounter (HOSPITAL_COMMUNITY)
Admission: RE | Disposition: A | Discharge status: Home / Self Care | Payer: Self-pay | Source: Home / Self Care | Attending: Urology

## 2024-02-19 DIAGNOSIS — Z91013 Allergy to seafood: Secondary | ICD-10-CM

## 2024-02-19 DIAGNOSIS — J45909 Unspecified asthma, uncomplicated: Secondary | ICD-10-CM | POA: Diagnosis present

## 2024-02-19 DIAGNOSIS — Z8349 Family history of other endocrine, nutritional and metabolic diseases: Secondary | ICD-10-CM | POA: Diagnosis not present

## 2024-02-19 DIAGNOSIS — N2889 Other specified disorders of kidney and ureter: Principal | ICD-10-CM | POA: Diagnosis present

## 2024-02-19 DIAGNOSIS — C642 Malignant neoplasm of left kidney, except renal pelvis: Principal | ICD-10-CM | POA: Diagnosis present

## 2024-02-19 DIAGNOSIS — Z808 Family history of malignant neoplasm of other organs or systems: Secondary | ICD-10-CM | POA: Diagnosis not present

## 2024-02-19 DIAGNOSIS — Z83438 Family history of other disorder of lipoprotein metabolism and other lipidemia: Secondary | ICD-10-CM | POA: Diagnosis not present

## 2024-02-19 DIAGNOSIS — Z881 Allergy status to other antibiotic agents status: Secondary | ICD-10-CM

## 2024-02-19 DIAGNOSIS — Z91010 Allergy to peanuts: Secondary | ICD-10-CM | POA: Diagnosis not present

## 2024-02-19 DIAGNOSIS — Z01818 Encounter for other preprocedural examination: Secondary | ICD-10-CM

## 2024-02-19 DIAGNOSIS — D49512 Neoplasm of unspecified behavior of left kidney: Secondary | ICD-10-CM | POA: Diagnosis not present

## 2024-02-19 HISTORY — PX: ROBOT ASSISTED LAPAROSCOPIC NEPHRECTOMY: SHX5140

## 2024-02-19 LAB — POCT PREGNANCY, URINE: Preg Test, Ur: NEGATIVE

## 2024-02-19 LAB — TYPE AND SCREEN
ABO/RH(D): O POS
Antibody Screen: NEGATIVE

## 2024-02-19 LAB — HEMOGLOBIN AND HEMATOCRIT, BLOOD
HCT: 31.4 % — ABNORMAL LOW (ref 36.0–46.0)
Hemoglobin: 10.2 g/dL — ABNORMAL LOW (ref 12.0–15.0)

## 2024-02-19 LAB — ABO/RH: ABO/RH(D): O POS

## 2024-02-19 SURGERY — NEPHRECTOMY, RADICAL, ROBOT-ASSISTED, LAPAROSCOPIC, ADULT
Anesthesia: General

## 2024-02-19 MED ORDER — STERILE WATER FOR IRRIGATION IR SOLN
Status: DC | PRN
  Administered 2024-02-19: 1000 mL

## 2024-02-19 MED ORDER — MAGNESIUM CITRATE PO SOLN: 1.0000 | Freq: Once | ORAL | Status: DC

## 2024-02-19 MED ORDER — DIPHENHYDRAMINE HCL 50 MG/ML IJ SOLN
12.5000 mg | Freq: Four times a day (QID) | INTRAMUSCULAR | Status: DC | PRN
  Administered 2024-02-19: 12.5 mg via INTRAVENOUS
  Filled 2024-02-19: qty 1

## 2024-02-19 MED ORDER — AMISULPRIDE (ANTIEMETIC) 5 MG/2ML IV SOLN: 10.0000 mg | Freq: Once | INTRAVENOUS | Status: DC | PRN

## 2024-02-19 MED ORDER — LIDOCAINE 2% (20 MG/ML) 5 ML SYRINGE
INTRAMUSCULAR | Status: DC | PRN
  Administered 2024-02-19: 50 mg via INTRAVENOUS

## 2024-02-19 MED ORDER — DIPHENHYDRAMINE HCL 12.5 MG/5ML PO ELIX: 12.5000 mg | ORAL_SOLUTION | Freq: Four times a day (QID) | ORAL | Status: DC | PRN

## 2024-02-19 MED ORDER — FENTANYL CITRATE (PF) 250 MCG/5ML IJ SOLN
INTRAMUSCULAR | Status: AC
Start: 2024-02-19 — End: ?
  Filled 2024-02-19: qty 5

## 2024-02-19 MED ORDER — OXYCODONE HCL 5 MG PO TABS: 5.0000 mg | ORAL_TABLET | Freq: Four times a day (QID) | ORAL | 0 refills | Status: DC | PRN

## 2024-02-19 MED ORDER — HYDROMORPHONE HCL 1 MG/ML IJ SOLN
INTRAMUSCULAR | Status: AC
Start: 2024-02-19 — End: ?
  Filled 2024-02-19: qty 1

## 2024-02-19 MED ORDER — OXYCODONE HCL 5 MG PO TABS
5.0000 mg | ORAL_TABLET | Freq: Once | ORAL | Status: AC | PRN
  Administered 2024-02-19: 5 mg via ORAL

## 2024-02-19 MED ORDER — MIDAZOLAM HCL 2 MG/2ML IJ SOLN
INTRAMUSCULAR | Status: AC
  Filled 2024-02-19: qty 2

## 2024-02-19 MED ORDER — KETAMINE HCL 10 MG/ML IJ SOLN
INTRAMUSCULAR | Status: DC | PRN
  Administered 2024-02-19 (×3): 10 mg via INTRAVENOUS

## 2024-02-19 MED ORDER — ROCURONIUM BROMIDE 10 MG/ML (PF) SYRINGE
PREFILLED_SYRINGE | INTRAVENOUS | Status: DC | PRN
  Administered 2024-02-19: 50 mg via INTRAVENOUS
  Administered 2024-02-19: 30 mg via INTRAVENOUS

## 2024-02-19 MED ORDER — CHLORHEXIDINE GLUCONATE 0.12 % MT SOLN
15.0000 mL | Freq: Once | OROMUCOSAL | Status: AC
  Administered 2024-02-19: 15 mL via OROMUCOSAL

## 2024-02-19 MED ORDER — ALBUTEROL SULFATE (2.5 MG/3ML) 0.083% IN NEBU: 3.0000 mL | INHALATION_SOLUTION | Freq: Four times a day (QID) | RESPIRATORY_TRACT | Status: DC | PRN

## 2024-02-19 MED ORDER — SODIUM CHLORIDE (PF) 0.9 % IJ SOLN
INTRAMUSCULAR | Status: DC | PRN
  Administered 2024-02-19: 40 mL

## 2024-02-19 MED ORDER — MIDAZOLAM HCL 2 MG/2ML IJ SOLN
INTRAMUSCULAR | Status: DC | PRN
  Administered 2024-02-19: 2 mg via INTRAVENOUS

## 2024-02-19 MED ORDER — ONDANSETRON HCL 4 MG/2ML IJ SOLN
INTRAMUSCULAR | Status: DC | PRN
  Administered 2024-02-19: 4 mg via INTRAVENOUS

## 2024-02-19 MED ORDER — HYDROMORPHONE HCL 1 MG/ML IJ SOLN
0.2500 mg | INTRAMUSCULAR | Status: DC | PRN
  Administered 2024-02-19 (×4): 0.5 mg via INTRAVENOUS

## 2024-02-19 MED ORDER — SCOPOLAMINE 1 MG/3DAYS TD PT72: 1.0000 | MEDICATED_PATCH | TRANSDERMAL | Status: DC

## 2024-02-19 MED ORDER — ONDANSETRON HCL 4 MG/2ML IJ SOLN
4.0000 mg | INTRAMUSCULAR | Status: DC | PRN
  Administered 2024-02-19 – 2024-02-20 (×2): 4 mg via INTRAVENOUS
  Filled 2024-02-19 (×2): qty 2

## 2024-02-19 MED ORDER — PROPOFOL 10 MG/ML IV BOLUS
INTRAVENOUS | Status: AC
  Filled 2024-02-19: qty 20

## 2024-02-19 MED ORDER — ORAL CARE MOUTH RINSE: 15.0000 mL | OROMUCOSAL | Status: DC | PRN

## 2024-02-19 MED ORDER — ONDANSETRON HCL 4 MG/2ML IJ SOLN: 4.0000 mg | Freq: Once | INTRAMUSCULAR | Status: DC | PRN

## 2024-02-19 MED ORDER — OXYCODONE HCL 5 MG PO TABS
5.0000 mg | ORAL_TABLET | ORAL | Status: DC | PRN
  Administered 2024-02-19 – 2024-02-21 (×6): 5 mg via ORAL
  Filled 2024-02-19 (×6): qty 1

## 2024-02-19 MED ORDER — ACETAMINOPHEN 10 MG/ML IV SOLN: 1000.0000 mg | Freq: Once | INTRAVENOUS | Status: DC | PRN

## 2024-02-19 MED ORDER — HYDROMORPHONE HCL 1 MG/ML IJ SOLN
0.5000 mg | INTRAMUSCULAR | Status: DC | PRN
  Administered 2024-02-19: 1 mg via INTRAVENOUS
  Filled 2024-02-19: qty 1

## 2024-02-19 MED ORDER — HYOSCYAMINE SULFATE 0.125 MG SL SUBL
0.1250 mg | SUBLINGUAL_TABLET | SUBLINGUAL | Status: DC | PRN
  Administered 2024-02-20: 0.125 mg via SUBLINGUAL
  Filled 2024-02-19: qty 1

## 2024-02-19 MED ORDER — PHENYLEPHRINE HCL (PRESSORS) 10 MG/ML IV SOLN
INTRAVENOUS | Status: DC | PRN
Start: 2024-02-19 — End: 2024-02-19
  Administered 2024-02-19: 160 ug via INTRAVENOUS

## 2024-02-19 MED ORDER — PROPOFOL 10 MG/ML IV BOLUS
INTRAVENOUS | Status: DC | PRN
  Administered 2024-02-19: 120 mg via INTRAVENOUS

## 2024-02-19 MED ORDER — LACTATED RINGERS IV SOLN: INTRAVENOUS | Status: DC

## 2024-02-19 MED ORDER — SUGAMMADEX SODIUM 200 MG/2ML IV SOLN
INTRAVENOUS | Status: DC | PRN
  Administered 2024-02-19: 200 mg via INTRAVENOUS

## 2024-02-19 MED ORDER — SODIUM CHLORIDE (PF) 0.9 % IJ SOLN
INTRAMUSCULAR | Status: AC
  Filled 2024-02-19: qty 20

## 2024-02-19 MED ORDER — CEFAZOLIN SODIUM-DEXTROSE 2-4 GM/100ML-% IV SOLN
2.0000 g | INTRAVENOUS | Status: AC
  Administered 2024-02-19: 2 g via INTRAVENOUS
  Filled 2024-02-19: qty 100

## 2024-02-19 MED ORDER — SODIUM CHLORIDE 0.9 % IV SOLN: INTRAVENOUS | Status: DC

## 2024-02-19 MED ORDER — ACETAMINOPHEN 10 MG/ML IV SOLN
1000.0000 mg | Freq: Four times a day (QID) | INTRAVENOUS | Status: AC
  Administered 2024-02-19 – 2024-02-20 (×4): 1000 mg via INTRAVENOUS
  Filled 2024-02-19 (×4): qty 100

## 2024-02-19 MED ORDER — FENTANYL CITRATE (PF) 250 MCG/5ML IJ SOLN
INTRAMUSCULAR | Status: DC | PRN
  Administered 2024-02-19 (×2): 50 ug via INTRAVENOUS
  Administered 2024-02-19: 100 ug via INTRAVENOUS

## 2024-02-19 MED ORDER — OXYCODONE HCL 5 MG/5ML PO SOLN: 5.0000 mg | Freq: Once | ORAL | Status: AC | PRN

## 2024-02-19 MED ORDER — ORAL CARE MOUTH RINSE: 15.0000 mL | Freq: Once | OROMUCOSAL | Status: AC

## 2024-02-19 MED ORDER — OXYCODONE HCL 5 MG PO TABS
ORAL_TABLET | ORAL | Status: AC
Start: 2024-02-19 — End: ?
  Filled 2024-02-19: qty 1

## 2024-02-19 MED ORDER — ONDANSETRON HCL 4 MG/2ML IJ SOLN
INTRAMUSCULAR | Status: AC
  Filled 2024-02-19: qty 2

## 2024-02-19 MED ORDER — DEXAMETHASONE SODIUM PHOSPHATE 10 MG/ML IJ SOLN
INTRAMUSCULAR | Status: DC | PRN
  Administered 2024-02-19: 10 mg via INTRAVENOUS

## 2024-02-19 MED ORDER — KETAMINE HCL 50 MG/5ML IJ SOSY
PREFILLED_SYRINGE | INTRAMUSCULAR | Status: AC
Start: 2024-02-19 — End: ?
  Filled 2024-02-19: qty 5

## 2024-02-19 MED ORDER — DOCUSATE SODIUM 100 MG PO CAPS
100.0000 mg | ORAL_CAPSULE | Freq: Two times a day (BID) | ORAL | Status: DC
  Administered 2024-02-19 – 2024-02-21 (×4): 100 mg via ORAL
  Filled 2024-02-19 (×4): qty 1

## 2024-02-19 MED ORDER — BUPIVACAINE LIPOSOME 1.3 % IJ SUSP
INTRAMUSCULAR | Status: AC
  Filled 2024-02-19: qty 20

## 2024-02-19 MED ORDER — ACETAMINOPHEN 500 MG PO TABS
1000.0000 mg | ORAL_TABLET | Freq: Once | ORAL | Status: AC
Start: 2024-02-19 — End: 2024-02-19
  Administered 2024-02-19: 1000 mg via ORAL
  Filled 2024-02-19: qty 2

## 2024-02-19 SURGICAL SUPPLY — 58 items
BAG COUNTER SPONGE SURGICOUNT (BAG) ×2 IMPLANT
BAG LAPAROSCOPIC 12 15 PORT 16 (BASKET) ×2 IMPLANT
CHLORAPREP W/TINT 26 (MISCELLANEOUS) ×2 IMPLANT
CLIP LIGATING HEM O LOK PURPLE (MISCELLANEOUS) ×2 IMPLANT
CLIP LIGATING HEMO LOK XL GOLD (MISCELLANEOUS) ×2 IMPLANT
CLIP LIGATING HEMO O LOK GREEN (MISCELLANEOUS) ×2 IMPLANT
COVER SURGICAL LIGHT HANDLE (MISCELLANEOUS) ×2 IMPLANT
COVER TIP SHEARS 8 DVNC (MISCELLANEOUS) ×2 IMPLANT
CUTTER ECHEON FLEX ENDO 45 340 (ENDOMECHANICALS) IMPLANT
DERMABOND ADVANCED .7 DNX12 (GAUZE/BANDAGES/DRESSINGS) ×4 IMPLANT
DRAIN CHANNEL 15F RND FF 3/16 (WOUND CARE) IMPLANT
DRAPE ARM DVNC X/XI (DISPOSABLE) ×8 IMPLANT
DRAPE COLUMN DVNC XI (DISPOSABLE) ×2 IMPLANT
DRAPE INCISE IOBAN 66X45 STRL (DRAPES) ×2 IMPLANT
DRAPE SHEET LG 3/4 BI-LAMINATE (DRAPES) ×2 IMPLANT
DRIVER NDL LRG 8 DVNC XI (INSTRUMENTS) ×4 IMPLANT
DRIVER NDLE LRG 8 DVNC XI (INSTRUMENTS) ×4 IMPLANT
ELECT PENCIL ROCKER SW 15FT (MISCELLANEOUS) ×2 IMPLANT
ELECT REM PT RETURN 15FT ADLT (MISCELLANEOUS) ×2 IMPLANT
EVACUATOR SILICONE 100CC (DRAIN) IMPLANT
FORCEPS BPLR FENES DVNC XI (FORCEP) ×2 IMPLANT
FORCEPS PROGRASP DVNC XI (FORCEP) ×2 IMPLANT
GAUZE 4X4 16PLY ~~LOC~~+RFID DBL (SPONGE) IMPLANT
GLOVE BIO SURGEON STRL SZ 6.5 (GLOVE) ×2 IMPLANT
GLOVE SURG LX STRL 7.5 STRW (GLOVE) ×4 IMPLANT
GOWN STRL REUS W/ TWL XL LVL3 (GOWN DISPOSABLE) ×4 IMPLANT
GOWN STRL SURGICAL XL XLNG (GOWN DISPOSABLE) ×2 IMPLANT
HOLDER FOLEY CATH W/STRAP (MISCELLANEOUS) ×2 IMPLANT
IRRIGATION SUCT STRKRFLW 2 WTP (MISCELLANEOUS) ×2 IMPLANT
KIT BASIN OR (CUSTOM PROCEDURE TRAY) ×2 IMPLANT
KIT TURNOVER KIT A (KITS) IMPLANT
LOOP VESSEL MAXI BLUE (MISCELLANEOUS) IMPLANT
NDL INSUFFLATION 14GA 120MM (NEEDLE) ×2 IMPLANT
NEEDLE INSUFFLATION 14GA 120MM (NEEDLE) ×2 IMPLANT
PORT ACCESS TROCAR AIRSEAL 12 (TROCAR) ×2 IMPLANT
PROTECTOR NERVE ULNAR (MISCELLANEOUS) ×4 IMPLANT
RELOAD STAPLE 45 2.6 WHT THIN (STAPLE) IMPLANT
SCISSORS MNPLR CVD DVNC XI (INSTRUMENTS) ×2 IMPLANT
SEAL UNIV 5-12 XI (MISCELLANEOUS) ×8 IMPLANT
SET TRI-LUMEN FLTR TB AIRSEAL (TUBING) ×2 IMPLANT
SOLUTION ELECTROSURG ANTI STCK (MISCELLANEOUS) ×2 IMPLANT
SPIKE FLUID TRANSFER (MISCELLANEOUS) ×2 IMPLANT
SPONGE T-LAP 18X18 ~~LOC~~+RFID (SPONGE) IMPLANT
SPONGE T-LAP 4X18 ~~LOC~~+RFID (SPONGE) IMPLANT
STAPLE RELOAD 45 WHT (STAPLE) ×14 IMPLANT
STAPLER POWER ECHELON 45 WIDE (STAPLE) IMPLANT
SUT ETHILON 3 0 PS 1 (SUTURE) IMPLANT
SUT MNCRL AB 4-0 PS2 18 (SUTURE) ×4 IMPLANT
SUT PDS AB 1 CT1 27 (SUTURE) ×6 IMPLANT
SUT VIC AB 2-0 SH 27X BRD (SUTURE) ×2 IMPLANT
SUT VICRYL 0 UR6 27IN ABS (SUTURE) IMPLANT
TOWEL OR 17X26 10 PK STRL BLUE (TOWEL DISPOSABLE) ×2 IMPLANT
TRAY FOLEY MTR SLVR 14FR STAT (SET/KITS/TRAYS/PACK) IMPLANT
TRAY FOLEY MTR SLVR 16FR STAT (SET/KITS/TRAYS/PACK) ×2 IMPLANT
TRAY LAPAROSCOPIC (CUSTOM PROCEDURE TRAY) ×2 IMPLANT
TROCAR Z THREAD OPTICAL 12X100 (TROCAR) ×2 IMPLANT
TROCAR Z-THREAD OPTICAL 5X100M (TROCAR) IMPLANT
WATER STERILE IRR 1000ML POUR (IV SOLUTION) ×2 IMPLANT

## 2024-02-19 NOTE — Progress Notes (Signed)
 Patient had one episode of emesis. RN gave PRN IV Zofran .

## 2024-02-19 NOTE — Transfer of Care (Signed)
 Immediate Anesthesia Transfer of Care Note  Patient: Tammy Hendrix  Procedure(s) Performed: NEPHRECTOMY, RADICAL, ROBOT-ASSISTED, LAPAROSCOPIC, ADULT (Left) LYMPHADENECTOMY, PELVIS, ROBOT-ASSISTED  Patient Location: PACU  Anesthesia Type:General  Level of Consciousness: drowsy and patient cooperative  Airway & Oxygen Therapy: Patient Spontanous Breathing  Post-op Assessment: Report given to RN and Post -op Vital signs reviewed and stable  Post vital signs: Reviewed and stable  Last Vitals:  Vitals Value Taken Time  BP 133/67 02/19/24 1511  Temp    Pulse 81 02/19/24 1515  Resp 19 02/19/24 1515  SpO2 100 % 02/19/24 1515  Vitals shown include unfiled device data.  Last Pain:  Vitals:   02/19/24 1055  TempSrc:   PainSc: 0-No pain         Complications: No notable events documented.

## 2024-02-19 NOTE — Discharge Instructions (Addendum)
1- Drain Sites - You may have some mild persistent drainage from old drain site for several days, this is normal. This can be covered with cotton gauze for convenience.  2 - Stiches - Your stitches are all dissolvable. You may notice a "loose thread" at your incisions, these are normal and require no intervention. You may cut them flush to the skin with fingernail clippers if needed for comfort.  3 - Diet - No restrictions  4 - Activity - No heavy lifting / straining (any activities that require valsalva or "bearing down") x 4 weeks. Otherwise, no restrictions.  5 - Bathing - You may shower immediately. Do not take a bath or get into swimming pool where incision sites are submersed in water x 4 weeks.   6 -  When to Call the Doctor - Call MD for any fever >102, any acute wound problems, or any severe nausea / vomiting. You can call the [facility] [phone]) 24 hours a day 365 days a year. It will roll-over to the answering service and on-call physician after hours.  You may resume aspirin, advil, aleve, vitamins, and supplements 7 days after surgery.

## 2024-02-19 NOTE — Anesthesia Procedure Notes (Signed)
 Procedure Name: Intubation Date/Time: 02/19/2024 12:37 PM  Performed by: Delona Ferron, CRNAPre-anesthesia Checklist: Patient identified, Emergency Drugs available, Suction available, Patient being monitored and Timeout performed Patient Re-evaluated:Patient Re-evaluated prior to induction Oxygen Delivery Method: Circle system utilized Preoxygenation: Pre-oxygenation with 100% oxygen Induction Type: IV induction Ventilation: Mask ventilation without difficulty Laryngoscope Size: Miller and 3 Grade View: Grade II Tube type: Oral Tube size: 7.0 mm Number of attempts: 1 Airway Equipment and Method: Stylet Placement Confirmation: ETT inserted through vocal cords under direct vision, positive ETCO2 and breath sounds checked- equal and bilateral Secured at: 21 cm Tube secured with: Tape Dental Injury: Teeth and Oropharynx as per pre-operative assessment

## 2024-02-19 NOTE — Brief Op Note (Signed)
 02/19/2024  3:01 PM  PATIENT:  Tammy Hendrix  54 y.o. female  PRE-OPERATIVE DIAGNOSIS:  LARGE LEFT RENAL MASS  POST-OPERATIVE DIAGNOSIS:  LARGE LEFT RENAL MASS  PROCEDURE:  Procedure(s): NEPHRECTOMY, RADICAL, ROBOT-ASSISTED, LAPAROSCOPIC, ADULT (Left) LYMPHADENECTOMY, PELVIS, ROBOT-ASSISTED (N/A)  SURGEON:  Surgeons and Role:    * Manny, Harvey Linen., MD - Primary  PHYSICIAN ASSISTANT:   ASSISTANTS: Carrolyn Clan PA   ANESTHESIA:   local and general  EBL:  100 mL   BLOOD ADMINISTERED:none  DRAINS: foley to gravity   LOCAL MEDICATIONS USED:  MARCAINE     SPECIMEN:  Source of Specimen:  LEFT kidney with peri-aortic lymph nodes  DISPOSITION OF SPECIMEN:  PATHOLOGY  COUNTS:  YES  TOURNIQUET:  * No tourniquets in log *  DICTATION: .Other Dictation: Dictation Number [id number]  PLAN OF CARE: Admit to inpatient   PATIENT DISPOSITION:  PACU - hemodynamically stable.   Delay start of Pharmacological VTE agent (>24hrs) due to surgical blood loss or risk of bleeding: yes

## 2024-02-19 NOTE — H&P (Signed)
 Tammy Hendrix is an 53 y.o. female.    Chief Complaint: Pre-OP LEFT Radical Nephrectomy  HPI:   1 - Large LEFT Renal Mass - 10cm left upper avidly enhancing mass on CT 01/2024 on eval abd pain. NO overt adenopathy but very aggressive appearance. 3 artery (1 upper accesory above main artgery, 1 very lower accessory) / 1 vein left renovasculat anatomy, numerous large parasitics. Chest imaging negative, contralateral kidney unremarkable.   PMH sig for seasonal allergy/ashma/inhailers, mild shellfish allergy (IV contrast OK). Husband Tammy Hendrix very involved. 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 HEr PCP is Tammy Judge MD   Today " Tammy Hendrix" is seen to proceed with LEFT radical nephrectomy for large / hypervascualr neoplasm. Hgb 12.2, Cr 0.94 most recently.   Past Medical History:  Diagnosis Date   Allergy    SEASONAL   Anemia    AFTER 3RD BABY   Asthma    Depression    Dermatitis    Heart murmur    TOLD AS A CHILD HAD MURMUR BUT NO ONE SAID SINCE    Past Surgical History:  Procedure Laterality Date   COLONOSCOPY     VAGINAL DELIVERY  2001, 2004, 2007   WISDOM TOOTH EXTRACTION     AGE 69 OR 20    Family History  Problem Relation Age of Onset   Hyperlipidemia Mother    Thyroid disease Mother    Thyroid cancer Mother 62   Hyperlipidemia Father    Bipolar disorder Father    Neuropathy Father    Colon cancer Neg Hx    Colon polyps Neg Hx    Esophageal cancer Neg Hx    Stomach cancer Neg Hx    Rectal cancer Neg Hx    Social History:  reports that she has never smoked. She has never used smokeless tobacco. She reports current alcohol use. She reports that she does not use drugs.  Allergies:  Allergies  Allergen Reactions   Peanuts [Peanut Oil] Rash   Shellfish Allergy Diarrhea   Bacitracin-Polymyxin B Rash    No medications prior to admission.    No results found for this or any previous visit (from the past 48 hours). No  results found.  Review of Systems  Constitutional:  Positive for fatigue. Negative for chills and fever.  All other systems reviewed and are negative.   There were no vitals taken for this visit. Physical Exam Vitals reviewed.  HENT:     Head: Normocephalic.  Eyes:     Pupils: Pupils are equal, round, and reactive to light.  Cardiovascular:     Rate and Rhythm: Normal rate.  Pulmonary:     Effort: Pulmonary effort is normal.  Abdominal:     General: Abdomen is flat.  Genitourinary:    Comments: Mild left CVAT  Musculoskeletal:        General: Normal range of motion.     Cervical back: Normal range of motion.  Skin:    General: Skin is warm.  Neurological:     General: No focal deficit present.     Mental Status: She is alert.  Psychiatric:        Mood and Affect: Mood normal.      Assessment/Plan  Proceed as planned with LEFT radical nephrectomy. Risks, benefits, alternatives, expected peri-op course discussed previously and reiterated today. She (and husband) understands that the very large / vascular nature of mass increases risks of acute anemia  and mortality substantially.   Tammy Hendrix., MD 02/19/2024, 8:10 AM

## 2024-02-20 ENCOUNTER — Encounter (HOSPITAL_COMMUNITY): Payer: Self-pay | Admitting: Urology

## 2024-02-20 LAB — BASIC METABOLIC PANEL WITH GFR
Anion gap: 7 (ref 5–15)
BUN: 13 mg/dL (ref 6–20)
CO2: 21 mmol/L — ABNORMAL LOW (ref 22–32)
Calcium: 8.2 mg/dL — ABNORMAL LOW (ref 8.9–10.3)
Chloride: 104 mmol/L (ref 98–111)
Creatinine, Ser: 1.03 mg/dL — ABNORMAL HIGH (ref 0.44–1.00)
GFR, Estimated: >60 mL/min (ref >60)
Glucose, Bld: 124 mg/dL — ABNORMAL HIGH (ref 70–99)
Potassium: 4.6 mmol/L (ref 3.5–5.1)
Sodium: 132 mmol/L — ABNORMAL LOW (ref 135–145)

## 2024-02-20 LAB — HEMOGLOBIN AND HEMATOCRIT, BLOOD
HCT: 34 % — ABNORMAL LOW (ref 36.0–46.0)
Hemoglobin: 10.9 g/dL — ABNORMAL LOW (ref 12.0–15.0)

## 2024-02-20 MED ORDER — PHENYLEPHRINE HCL-NACL 20-0.9 MG/250ML-% IV SOLN
INTRAVENOUS | Status: AC
  Filled 2024-02-20: qty 250

## 2024-02-20 MED ORDER — ACETAMINOPHEN 325 MG PO TABS
650.0000 mg | ORAL_TABLET | ORAL | Status: DC | PRN
  Administered 2024-02-20 – 2024-02-21 (×2): 650 mg via ORAL
  Filled 2024-02-20 (×3): qty 2

## 2024-02-20 MED ORDER — SENNOSIDES-DOCUSATE SODIUM 8.6-50 MG PO TABS: 1.0000 | ORAL_TABLET | Freq: Two times a day (BID) | ORAL | 0 refills | Status: DC

## 2024-02-20 NOTE — Op Note (Signed)
 NAMEARDYTH, KELSO MEDICAL RECORD NO: [id number] ACCOUNT NO: [id number] DATE OF BIRTH: August 28, 1971 FACILITY: [facility] LOCATION: [id number] PHYSICIAN: Osborn Blaze, MD  Operative Report   DATE OF PROCEDURE: 02/19/2024  PREOPERATIVE DIAGNOSES: 1.  Large left renal mass. 2.  Flank pain. 3.  Hematuria.  PROCEDURE PERFORMED:  Robotic assisted laparoscopic left radical nephrectomy with retroperitoneal lymph node dissection.  ESTIMATED BLOOD LOSS:   COMPLICATIONS:  None.  SPECIMENS:  Left kidney with periaortic lymph nodes en bloc.  FINDINGS: 1.  Three artery, one vein left renal vascular anatomy. 2.  Numerous parasitic vessels. 3.  Shoddy hilar adenopathy.  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  She was found on workup of flank pain and hematuria to have a very large left renal neoplasm.  It was quite vascular.  Hematuria and fatigue were quite impressive.  There was no obvious  distant disease.  Options were discussed for management and we recommended a path of left radical nephrectomy and she wished to proceed in the semi-elective setting.  She presents for this today.  Informed consent was obtained and placed in the medical  record.  DESCRIPTION OF PROCEDURE:  The patient being Tammy Hendrix verified and the procedure being a left radical nephrectomy with retroperitoneal lymph node dissection was confirmed.  Procedure time out was performed.  Intravenous antibiotics administered.   General endotracheal anesthesia induced.  The patient was placed in a left side up full flank position and pulling 15 degrees of table flexion, superior arm elevator.  Sequential compression devices, with the bottom leg bent and top leg straight.  A  Foley catheter was placed per urethra to straight drain.  She was further fastened to the operating table using 3-inch tape foam padding across the supraxiphoid chest, and pelvis.  Bean bag was deployed as well a an axillary roll and  a superior arm  elevator.  Sterile field was created, prepped, and draped the patient's abdomen and left flank using chlorhexidine gluconate, and a high-flow low-pressure pneumoperitoneum was then obtained using Veress technique in the left lower quadrant, having passed  the aspiration and drop test.  An 8-mm robotic camera port was then placed in position approximately 1.5 handbreadth superolateral to the umbilicus.  Laparoscopic exam of peritoneal cavity revealed no significant adhesions.  No visceral injury, but a  limited working space given her very petite body habitus.  Additional ports were placed as follows:  Left subcostal 8-mm robotic port, left far lateral 8-mm robotic port proximally 4 fingerbreadths superomedial to the anterior iliac spine, left  paramedian inferior robotic port proximally 4 handbreadths superior to the pubic ramus, and two 12-mm assistant port sites in the midline, one in the supraumbilical crease and another approximately 2 fingerbreadths superior to the camera port, superior  port being AirSeal type.  Robot was docked and passed the electronic checks.  Attention was directed at the development of the retroperitoneum and this made lateral to the ascending colon from the area of the splenic flexure towards the area of the  internal ring.  The colon was carefully swept medially.  There was noted to be desmoplastic reaction at the retroperitoneum with neoplastic edema and the colonic mesentery was carefully dissected away from the anterior surface of the Gerota's fascia  taking exquisite care to avoid devascularization of the mesentery.  The lateral splenic attachments were carefully taken down allowing the spleen and pancreas to medially rotate away from the anterior surface of Gerota's fascia.  The lower  pole of the  kidney was identified and placed on gentle lateral traction.  Dissection was then carried medial to this.  Greater than one gonadal vessel was encountered.  The  gonadal vessel was very very large with numerous parasitic vessels contributing towards it.   The psoas musculature was identified, and dissection proceeded within this plane towards the area of the renal hilum.  The aorta was noted in a plane which was directly lateral to this.  Thus, the pelvic lymph nodes were carefully dissected away from the  aorta towards the area of the nephrectomy specimen, and lymphostasis was achieved with cold clips.  There was a small lower accessory artery that was controlled using a vascular stapler as anticipated.  The dominant renal hilum was a single artery and  single vein with an impressive lumbar vessel that was wrapping around the inferior aspect of the artery.  The lumbar vessel was very carefully controlled using purple clips x 2, transecting it, allowing much better window to the left renal artery, which  was controlled using an extra large Hem-o-Lok clip proximal and a vascular stapler distal.  The large dominant vein was carefully mobilized circumferentially as was lymphatic tissue away from the aorta, and the dominant vein was controlled using a  vascular stapler keeping maximal lymphatic tissue with the nephrectomy specimen.  Dissection proceeded superiorly.  The medial adrenal attachment was taken down using a vascular stapler.  There did appear to be a small superior artery as well that was  controlled with this.  This was performed in two staple loads.  The kidney was then placed on inferior traction.  Superior attachments were carefully taken down using cautery scissors.  There was some desmoplasia and adherence to the diaphragm, but no  obvious diaphragmatic injury was noted.  Lateral attachments were also taken down with cautery scissors.  Several areas of parasitic vessels were noted and controlled using Hem-o-Lok clips.  Finally, the ureter was triply clipped, ligated, and an  enlarged gonadal vessel was controlled using a vascular stapler.  This  completely freed up the large left radical nephrectomy specimen with peritoneal lymph nodes en bloc.  Hemostasis was excellent.  Sponge and needle counts were correct.  We achieved  the goals of the extirpative portion of the procedure today.   Robot was undocked.  The specimens were retrieved by connecting the previous assistant port site in the midline, removing the left radical nephrectomy with en bloc lymph nodes, and setting it  aside for permanent pathology.  The extraction site was closed in at the level of fascial using figure-of-eight PDS x 7, following reapproximation of Scarpa's with running Vicryl.  All incision sites were infiltrated with dilute lipolyzed Marcaine  and  closed at the level of skin using subcuticular Monocryl followed by Dermabond.  Procedure was then terminated.  The patient tolerated the procedure well.  No immediate perioperative complications.  The patient was taken to postanesthesia care unit in  stable condition.  Plan for patient admission.  Please note that Carrolyn Clan was crucial for surgery today.  She provided invaluable retraction, suctioning, vascular clipping, vascular stapling, robotic instrument exchange, and general first assistance.    MUK D: 02/19/2024 3:10:13 pm T: 02/20/2024 2:09:00 am  JOB: [id number]/ [id number]

## 2024-02-20 NOTE — TOC Initial Note (Signed)
 Transition of Care [facility]) - Initial/Assessment Note    Patient Details  Name: Tammy Hendrix MRN: [id number] Date of Birth: [birth year]  Transition of [facility]) CM/SW Contact:    Ruben Corolla, RN Phone Number: 02/20/2024, 12:39 PM  Clinical Narrative: d/c plan home.                  Expected Discharge Plan: Home/Self Care Barriers to Discharge: Continued Medical Work up   Patient Goals and CMS Choice Patient states their goals for this hospitalization and ongoing recovery are:: Home CMS Medicare.gov Compare Post Acute Care list provided to:: Patient Choice offered to / list presented to : Patient      Expected Discharge Plan and Services                                              Prior Living Arrangements/Services                       Activities of Daily Living   ADL Screening (condition at time of admission) Independently performs ADLs?: Yes (appropriate for developmental age) Is the patient deaf or have difficulty hearing?: No Does the patient have difficulty seeing, even when wearing glasses/contacts?: No Does the patient have difficulty concentrating, remembering, or making decisions?: No  Permission Sought/Granted                  Emotional Assessment              Admission diagnosis:  Left renal mass [N28.89] Renal mass [N28.89] Patient Active Problem List   Diagnosis Date Noted   Renal mass 02/19/2024   Left renal mass 01/27/2024   Reactive thrombocytosis 01/27/2024   Acute UTI 01/27/2024   Varicose veins of bilateral lower extremities with other complications 10/08/2012   Osteopenia 10/08/2012   PCP:  Sylvan Evener, MD Pharmacy:   [facility] [phone] - [place], [place] - [address] AT [facility] [address] [facility] [address] [place] [address] Phone: [phone] Fax: [phone]  [id number] [place], [place] - [address] AT [facility] OF [facility] [address] & [facility] [address] [place] [address] Phone: [phone] Fax: [phone]     Social Drivers of Health (SDOH) Social History: SDOH Screenings   Food Insecurity: No Food Insecurity (02/19/2024)  Housing: Low Risk  (02/19/2024)  Transportation Needs: No Transportation Needs (02/19/2024)  Utilities: Not At Risk (02/19/2024)  Depression (PHQ2-9): Low Risk  (05/01/2023)  Tobacco Use: Low Risk  (02/19/2024)   SDOH Interventions:     Readmission Risk Interventions     No data to display

## 2024-02-20 NOTE — Plan of Care (Signed)
  Problem: Clinical Measurements: Goal: Will remain free from infection Outcome: Progressing   Problem: Clinical Measurements: Goal: Diagnostic test results will improve Outcome: Progressing   Problem: Clinical Measurements: Goal: Respiratory complications will improve Outcome: Progressing   Problem: Activity: Goal: Risk for activity intolerance will decrease Outcome: Progressing   Problem: Nutrition: Goal: Adequate nutrition will be maintained Outcome: Progressing   

## 2024-02-20 NOTE — Anesthesia Postprocedure Evaluation (Signed)
 Anesthesia Post Note  Patient: Tammy Hendrix  Procedure(s) Performed: NEPHRECTOMY, RADICAL, ROBOT-ASSISTED, LAPAROSCOPIC, ADULT (Left) LYMPHADENECTOMY, PELVIS, ROBOT-ASSISTED     Patient location during evaluation: PACU Anesthesia Type: General Level of consciousness: awake and alert Pain management: pain level controlled Vital Signs Assessment: post-procedure vital signs reviewed and stable Respiratory status: spontaneous breathing, nonlabored ventilation, respiratory function stable and patient connected to nasal cannula oxygen Cardiovascular status: blood pressure returned to baseline and stable Postop Assessment: no apparent nausea or vomiting Anesthetic complications: no   No notable events documented.              Tammy Hendrix

## 2024-02-20 NOTE — Progress Notes (Signed)
 1 Day Post-Op   Subjective/Chief Complaint:  1 -Large LEFT Renal Mass - s/p LEFT robotic radical nephretomy 02/19/24 for large, vascular mass. POD 1 Hgb 10.9, Cr 1.1, and catheter remove.   Today "Tammy Hendrix" is progressing well. Ambulated x several, Keeping things down w/o emesis. Abdomen very sore but somewhat reluctant to use pain meds.   Objective: Vital signs in last 24 hours: Temp:  [97.3 F (36.3 C)-98.6 F (37 C)] 98.6 F (37 C) (05/30 1236) Pulse Rate:  [67-81] 81 (05/30 1236) Resp:  [11-21] 21 (05/30 1236) BP: (120-142)/(69-83) 139/69 (05/30 1236) SpO2:  [100 %] 100 % (05/30 1236) Weight:  [58.5 kg] 58.5 kg (05/29 1645) Last BM Date : 02/19/24  Intake/Output from previous day: 05/29 0701 - 05/30 0700 In: 3482.1 [P.O.:340; I.V.:2942.1; IV Piggyback:200] Out: 400 [Urine:300; Blood:100] Intake/Output this shift: Total I/O In: 240 [P.O.:240] Out: 400 [Urine:400]  Very pleasant with husband at bedside. AOx3 Non-labored breathing on RA RRR Recent incision sites c/d/I, mild bruising No foley No c/c/e  Lab Results:  Recent Labs    02/19/24 1519 02/20/24 0443  HGB 10.2* 10.9*  HCT 31.4* 34.0*   BMET Recent Labs    02/20/24 0443  NA 132*  K 4.6  CL 104  CO2 21*  GLUCOSE 124*  BUN 13  CREATININE 1.03*  CALCIUM 8.2*   PT/INR No results for input(s): "LABPROT", "INR" in the last 72 hours. ABG No results for input(s): "PHART", "HCO3" in the last 72 hours.  Invalid input(s): "PCO2", "PO2"  Studies/Results: No results found.  Anti-infectives: Anti-infectives (From admission, onward)    Start     Dose/Rate Route Frequency Ordered Stop   02/19/24 1032  ceFAZolin (ANCEF) IVPB 2g/100 mL premix        2 g 200 mL/hr over 30 Minutes Intravenous 30 min pre-op 02/19/24 1032 02/19/24 1245       Assessment/Plan:  Doing very well POD 1 s/p nephrectomy for large mass. Goals for DC discussed. I feel she would be safe this afternoon versus tomorrow. At this  point she opts for tomorrow. Very reasonable.    Melody Spurling. 02/20/2024

## 2024-02-21 MED ORDER — OXYCODONE HCL 5 MG PO TABS
5.0000 mg | ORAL_TABLET | ORAL | Status: DC | PRN
  Administered 2024-02-21: 5 mg via ORAL
  Filled 2024-02-21: qty 1

## 2024-02-21 MED ORDER — OXYCODONE HCL 5 MG PO TABS: 10.0000 mg | ORAL_TABLET | ORAL | Status: DC | PRN

## 2024-02-21 NOTE — Discharge Summary (Signed)
 Date of admission: 02/19/2024  Date of discharge: 02/21/2024  Admission diagnosis: large left renal mass  Discharge diagnosis: same  Secondary diagnoses:  Patient Active Problem List   Diagnosis Date Noted   Renal mass 02/19/2024   Left renal mass 01/27/2024   Reactive thrombocytosis 01/27/2024   Acute UTI 01/27/2024   Varicose veins of bilateral lower extremities with other complications 10/08/2012   Osteopenia 10/08/2012    Procedures performed: Procedure(s): NEPHRECTOMY, RADICAL, ROBOT-ASSISTED, LAPAROSCOPIC, ADULT LYMPHADENECTOMY, PELVIS, ROBOT-ASSISTED  History and Physical: For full details, please see admission history and physical. Briefly, Tammy Hendrix is a 53 y.o. year old patient with a 10cm left renal mass who is s/p robotic radical left nephrectomy on 02/19/24 with Dr. Secundino Dach.   Hospital Course: Patient tolerated the procedure well.  She was then transferred to the floor after an uneventful PACU stay.  Her hospital course was uncomplicated.  On POD#2 she had met discharge criteria: was eating a regular diet, was up and ambulating independently,  pain was well controlled, was voiding without a catheter, and was ready to for discharge.   Laboratory values:  Recent Labs    02/19/24 1519 02/20/24 0443  HGB 10.2* 10.9*  HCT 31.4* 34.0*   Recent Labs    02/20/24 0443  NA 132*  K 4.6  CL 104  CO2 21*  GLUCOSE 124*  BUN 13  CREATININE 1.03*  CALCIUM 8.2*   No results for input(s): "LABPT", "INR" in the last 72 hours. No results for input(s): "LABURIN" in the last 72 hours. Results for orders placed or performed in visit on 01/27/24  Culture, Urine     Status: Abnormal   Collection Time: 01/27/24 12:19 PM   Specimen: Urine, Clean Catch  Result Value Ref Range Status   Specimen Description   Final    URINE, CLEAN CATCH Performed at [facility], [address] [place], [place] [address]    Special Requests   Final     NONE Performed at [facility], [address], [place], [place] [address]    Culture MULTIPLE SPECIES PRESENT, SUGGEST RECOLLECTION (A)  Final   Report Status 01/28/2024 FINAL  Final    Physical Exam  Gen: NAD Resp: Satting well on RA Card: Regular rate Abd: Soft, appropriately tender, ND, incision clean dry and intact GU: Voing spontaneously  Neuro: Alert   Disposition: Home  Discharge instruction: The patient was instructed to be ambulatory but told to refrain from heavy lifting, strenuous activity, or driving.   Discharge medications:  Allergies as of 02/21/2024       Reactions   Peanuts [peanut Oil] Rash   Shellfish Allergy Diarrhea   Bacitracin-polymyxin B Rash        Medication List     STOP taking these medications    ibuprofen  100 MG tablet Commonly known as: ADVIL    VITAMIN B12 PO       TAKE these medications    albuterol  108 (90 Base) MCG/ACT inhaler Commonly known as: VENTOLIN  HFA Inhale 2 puffs into the lungs every 6 (six) hours as needed for shortness of breath.   ALPRAZolam  0.5 MG tablet Commonly known as: Xanax  Take 1 tablet (0.5 mg total) by mouth at bedtime as needed for anxiety.   budesonide-formoterol 80-4.5 MCG/ACT inhaler Commonly known as: SYMBICORT SMARTSIG:By Mouth   cetirizine 10 MG tablet Commonly known as: ZYRTEC Take 10 mg by mouth daily.   ciprofloxacin  500 MG tablet Commonly known as:  Cipro  Take 1 tablet (500 mg total) by mouth 2 (two) times daily.   clindamycin 1 % lotion Commonly known as: CLEOCIN T Apply 1 as directed to affected area once a day   desonide 0.05 % cream Commonly known as: DESOWEN SMARTSIG:sparingly Topical Twice Daily   Intrarosa 6.5 MG Inst Generic drug: Prasterone Place vaginally. 2-3 X A WEEK   Norethin-Eth Estradiol-Fe 0.4-35 MG-MCG tablet Commonly known as: FEMCON FE Chew 1 tablet by mouth daily.   ondansetron  4 MG disintegrating tablet Commonly known as:  ZOFRAN -ODT Take 1 tablet (4 mg total) by mouth every 8 (eight) hours as needed for nausea or vomiting.   oxyCODONE  5 MG immediate release tablet Commonly known as: Roxicodone  Take 1-2 tablets (5-10 mg total) by mouth every 6 (six) hours as needed for severe pain (pain score 7-10) or moderate pain (pain score 4-6). What changed: how much to take   Pataday 0.2 % Soln Generic drug: Olopatadine HCl 1 drop into affected eye   prochlorperazine  10 MG tablet Commonly known as: COMPAZINE  Take 1 tablet (10 mg total) by mouth every 8 (eight) hours as needed for nausea or vomiting.   senna-docusate 8.6-50 MG tablet Commonly known as: Senokot-S Take 1 tablet by mouth 2 (two) times daily. While taking strong pain meds to prevent constipation.        Followup:   Follow-up Information     Secundino Dach Harvey Linen., MD Follow up on 03/02/2024.   Specialty: Urology Why: at 11:30 for MD visit and pathology review. Contact information: [address] [place] [address] [phone]

## 2024-02-24 LAB — SURGICAL PATHOLOGY

## 2024-02-27 ENCOUNTER — Telehealth: Payer: Self-pay

## 2024-02-27 DIAGNOSIS — N2889 Other specified disorders of kidney and ureter: Secondary | ICD-10-CM

## 2024-02-27 NOTE — Progress Notes (Signed)
 Complex Care Management Note Care Guide Note  02/27/2024 Name: Tammy Hendrix MRN: [id number] DOB: June 11, 1971   Complex Care Management Outreach Attempts: An unsuccessful telephone outreach was attempted today to offer the patient information about available complex care management services.  Follow Up Plan:  Additional outreach attempts will be made to offer the patient complex care management information and services.   Encounter Outcome:  No Answer  Creola Doheny [facility], [facility] Guide  Direct Dial: [phone]  Fax [phone]

## 2024-03-01 NOTE — Progress Notes (Unsigned)
 Complex Care Management Note Care Guide Note  03/01/2024 Name: Tammy Hendrix MRN: [id number] DOB: June 11, 1971   Complex Care Management Outreach Attempts: A second unsuccessful outreach was attempted today to offer the patient with information about available complex care management services.  Follow Up Plan:  Additional outreach attempts will be made to offer the patient complex care management information and services.   Encounter Outcome:  No Answer  Creola Doheny [facility], [facility] Guide  Direct Dial: [phone]  Fax [phone]

## 2024-03-02 DIAGNOSIS — Z905 Acquired absence of kidney: Secondary | ICD-10-CM | POA: Diagnosis not present

## 2024-03-02 DIAGNOSIS — C642 Malignant neoplasm of left kidney, except renal pelvis: Secondary | ICD-10-CM | POA: Diagnosis not present

## 2024-03-03 NOTE — Progress Notes (Signed)
 Complex Care Management Note Care Guide Note  03/03/2024 Name: Tammy Hendrix MRN: [id number] DOB: Feb 21, 1971   Complex Care Management Outreach Attempts: A third unsuccessful outreach was attempted today to offer the patient with information about available complex care management services.  Follow Up Plan:  No further outreach attempts will be made at this time. We have been unable to contact the patient to offer or enroll patient in complex care management services.  Encounter Outcome:  Patient Refused  Creola Doheny [facility], [facility] Guide  Direct Dial: [phone]  Fax [phone]

## 2024-03-09 NOTE — Progress Notes (Unsigned)
 [facility] OFFICE PROGRESS NOTE  Patient Care Team: Sylvan Evener, MD as PCP - General (Internal Medicine)  Tammy Hendrix is a 53 y.o.female with unremarkable history being follow [facility] for left renal cell carcinoma.  Diagnosis: pT3a pN0 cM0. 9.9 x 6.8 x 8.1 cm G4 LEFT renal clear cell RCC. Treatment: left radical nephrectomy with retroperitoneal lymph node dissection.   We discussed the standard of care as of today for stage III renal cell carcinoma.  Adjuvant therapy to decrease risk of recurrence is recommended.  Adjuvant pembrolizumab for 1 year is approved by FDA in this setting as of today.  Per clinical trial from [employer], the estimated overall survival at 4 years was 91.2% in the pembrolizumab group, as compared with 86.0% in the placebo group; the benefit was consistent across key subgroups. The estimated percentage of participants who were alive and free from recurrence at 48 months were 64.9% in the pembrolizumab group and 56.6% in placebo group. NEJM 2024 ;[id number].  Side effects of immunotherapy are mostly related to immune related reaction.  They depend on which organ may be affected.  Patient can have hyper or hypothyroidism, skin rash, fever, pneumonitis, hepatitis, carditis, colitis, nephritis or other endorgan damage from immune related reaction.  Severe fatal reaction such as carditis or encephalitis has been reported. Rarely severe side effects can result in death.  After discussion Tammy Hendrix understands and would like to proceed.   Will communicate with scheduling on setting up treatment Assessment & Plan Clear cell renal cell carcinoma, left (HCC) Teaching Will start adjuvant pembrolizumab on 7/7 if able Labs, MD visit and treatment every 3 weeks  MRI of brain Anemia, unspecified type Will check b12, folate and ferritin. Stage 3a chronic kidney disease (HCC) Solitary kidney Monitor renal function.  Plan to start treatment on  7/7. Lab, MD visit and infusion every 3 weeks.   I spent a total of 51 minutes including review of chart and pathology tests results, face-to-face time with the patient, and husband discussions about pathology, treatment, prognosis, potential side effects, plan of care and coordination of care plan with other staff members.  Lowanda Ruddy, MD  INTERVAL HISTORY: Patient returns for follow-up. Recovering well from surgery. Some insomnia. No difficulty urinating, hematuria, frequency.  Oncology History  Clear cell renal cell carcinoma, left (HCC)  01/24/2024 Imaging   CT CAP 1. Heterogeneous mass with cystic and solid regions and calcifications in the mid to upper left kidney measuring 9.9 x 6.8 x 8.1 cm, concerning for renal cell carcinoma. There is moderate hydronephrosis involving the lower pole of the left kidney without evidence of obstructing lesion. There is no definite evidence renal vein thrombosis or lymphadenopathy. 2. Trace right pleural effusion with atelectasis at the lung bases. No pulmonary nodule or mass is seen. 3. Aortic atherosclerosis.   01/27/2024 Initial Diagnosis   Clear cell renal cell carcinoma, left (HCC)   02/19/2024 Pathology Results   LEFT KIDNEY WITH PERIAORTIC LYMPH NODES, RESECTION:  Clear cell renal cell carcinoma, nuclear grade 4, size 9.0 cm  Tumor extends into segmental branches of renal vein, perirenal and renal sinus fat, but not beyond Gerotas fascia (pT3a)  Ureteral, vascular and all margins of resection are negative for tumor   Sarcomatoid Features: Negative  Rhabdoid Features: Negative   Regional Lymph Nodes:       No lymph nodes submitted or found: NA       Number of Lymph Nodes Involved: 0  Number of Lymph Nodes Examined: 2     03/09/2024 Cancer Staging   Staging form: Kidney, AJCC 8th Edition - Clinical: Stage III (cT3a, cN0, cM0) - Signed by Lowanda Ruddy, MD on 03/09/2024 Histologic grade (G): G4 Histologic grading system: 4  grade system   03/29/2024 -  Chemotherapy   Patient is on Treatment Plan : RENAL CELL Pembrolizumab (200) + Axitinib q21d        PHYSICAL EXAMINATION: ECOG PERFORMANCE STATUS: 1 - Symptomatic but completely ambulatory  Vitals:   03/11/24 1534  BP: 131/85  Pulse: 82  Resp: 18  Temp: (!) 97.3 F (36.3 C)  SpO2: 100%   Filed Weights   03/11/24 1534  Weight: 120 lb 14.4 oz (54.8 kg)    GENERAL: alert, no distress and comfortable  Relevant data reviewed during this visit included pathology, labs.

## 2024-03-10 NOTE — Assessment & Plan Note (Addendum)
 Will start adjuvant pembrolizumab MRI of brain

## 2024-03-11 ENCOUNTER — Inpatient Hospital Stay

## 2024-03-11 VITALS — BP 131/85 | HR 82 | Temp 97.3°F | Resp 18 | Ht 62.5 in | Wt 120.9 lb

## 2024-03-11 DIAGNOSIS — Z85528 Personal history of other malignant neoplasm of kidney: Secondary | ICD-10-CM | POA: Diagnosis not present

## 2024-03-11 DIAGNOSIS — Z905 Acquired absence of kidney: Secondary | ICD-10-CM | POA: Diagnosis not present

## 2024-03-11 DIAGNOSIS — N1831 Chronic kidney disease, stage 3a: Secondary | ICD-10-CM | POA: Diagnosis not present

## 2024-03-11 DIAGNOSIS — N183 Chronic kidney disease, stage 3 unspecified: Secondary | ICD-10-CM | POA: Insufficient documentation

## 2024-03-11 DIAGNOSIS — C642 Malignant neoplasm of left kidney, except renal pelvis: Secondary | ICD-10-CM

## 2024-03-11 DIAGNOSIS — D649 Anemia, unspecified: Secondary | ICD-10-CM | POA: Diagnosis not present

## 2024-03-11 MED ORDER — ONDANSETRON HCL 8 MG PO TABS
8.0000 mg | ORAL_TABLET | Freq: Three times a day (TID) | ORAL | 1 refills | Status: DC | PRN
Start: 2024-03-11 — End: 2024-03-22

## 2024-03-11 MED ORDER — PROCHLORPERAZINE MALEATE 10 MG PO TABS: 10.0000 mg | ORAL_TABLET | Freq: Four times a day (QID) | ORAL | 1 refills | Status: DC | PRN

## 2024-03-11 NOTE — Assessment & Plan Note (Addendum)
 Will check b12, folate and ferritin.

## 2024-03-11 NOTE — Assessment & Plan Note (Addendum)
 Solitary kidney Monitor renal function.

## 2024-03-11 NOTE — Progress Notes (Signed)
START ON PATHWAY REGIMEN - Renal Cell     A cycle is every 21 days:     Pembrolizumab   **Always confirm dose/schedule in your pharmacy ordering system**  Patient Characteristics: Postoperative without Neoadjuvant Therapy, M0 (Pathologic Staging), Stage I, II, III, or Resected T4M0 Stage IV, Intermediate-High Risk Therapeutic Status: Postoperative without Neoadjuvant Therapy, M0 (Pathologic Staging) AJCC M Category: cM0 AJCC 8 Stage Grouping: III AJCC T Category: pT3a AJCC N Category: pN0 Risk Status: Intermediate-High Risk Intent of Therapy: Curative Intent, Discussed with Patient 

## 2024-03-12 ENCOUNTER — Other Ambulatory Visit: Payer: Self-pay

## 2024-03-13 ENCOUNTER — Other Ambulatory Visit: Payer: Self-pay

## 2024-03-15 ENCOUNTER — Telehealth: Payer: Self-pay | Admitting: *Deleted

## 2024-03-15 NOTE — Telephone Encounter (Signed)
 Tammy Hendrix called to see if she can cancel the 7/7 appts as her daughter will be in town and does not want to be feeling bad while she is here.

## 2024-03-16 NOTE — Telephone Encounter (Signed)
 Most people don't have immediate rxn. But Yes she can pick another day if infusion has room.  LM for patient with Dr Fredrik message

## 2024-03-17 ENCOUNTER — Ambulatory Visit (HOSPITAL_COMMUNITY)

## 2024-03-17 ENCOUNTER — Telehealth: Payer: Self-pay | Admitting: Nurse Practitioner

## 2024-03-17 NOTE — Telephone Encounter (Signed)
 Left a voicemail with the scheduled appointment details.

## 2024-03-20 ENCOUNTER — Other Ambulatory Visit: Payer: Self-pay

## 2024-03-22 ENCOUNTER — Other Ambulatory Visit: Payer: Self-pay

## 2024-03-22 DIAGNOSIS — C642 Malignant neoplasm of left kidney, except renal pelvis: Secondary | ICD-10-CM

## 2024-03-22 NOTE — Progress Notes (Signed)
 Pharmacist Chemotherapy Monitoring - Initial Assessment    Anticipated start date: 03/30/24   The following has been reviewed per standard work regarding the patient's treatment regimen: The patient's diagnosis, treatment plan and drug doses, and organ/hematologic function Lab orders and baseline tests specific to treatment regimen  The treatment plan start date, drug sequencing, and pre-medications Prior authorization status  Patient's documented medication list, including drug-drug interaction screen and prescriptions for anti-emetics and supportive care specific to the treatment regimen The drug concentrations, fluid compatibility, administration routes, and timing of the medications to be used The patient's access for treatment and lifetime cumulative dose history, if applicable  The patient's medication allergies and previous infusion related reactions, if applicable   Changes made to treatment plan:  N/A  Follow up needed:  N/A   Tammy Hendrix, PharmD, MBA

## 2024-03-22 NOTE — Progress Notes (Signed)
 ON PATHWAY REGIMEN - Renal Cell  No Change  Continue With Treatment as Ordered.  Original Decision Date/Time: 03/11/2024 16:26     A cycle is every 21 days:     Pembrolizumab   **Always confirm dose/schedule in your pharmacy ordering system**  Patient Characteristics: Postoperative without Neoadjuvant Therapy, M0 (Pathologic Staging), Stage I, II, III, or Resected T4M0 Stage IV, Intermediate-High Risk Therapeutic Status: Postoperative without Neoadjuvant Therapy, M0 (Pathologic Staging) AJCC M Category: cM0 AJCC 8 Stage Grouping: III AJCC T Category: pT3a AJCC N Category: pN0 Risk Status: Intermediate-High Risk Intent of Therapy: Curative Intent, Discussed with Patient

## 2024-03-23 ENCOUNTER — Telehealth: Payer: Self-pay | Admitting: Nurse Practitioner

## 2024-03-23 ENCOUNTER — Telehealth: Payer: Self-pay

## 2024-03-23 ENCOUNTER — Inpatient Hospital Stay

## 2024-03-23 DIAGNOSIS — Z79899 Other long term (current) drug therapy: Secondary | ICD-10-CM | POA: Insufficient documentation

## 2024-03-23 DIAGNOSIS — Z85528 Personal history of other malignant neoplasm of kidney: Secondary | ICD-10-CM | POA: Insufficient documentation

## 2024-03-23 NOTE — Telephone Encounter (Signed)
 Rescheduled appointments per the patients request.

## 2024-03-23 NOTE — Telephone Encounter (Signed)
 error

## 2024-03-24 ENCOUNTER — Other Ambulatory Visit: Payer: Self-pay

## 2024-03-24 ENCOUNTER — Ambulatory Visit (HOSPITAL_COMMUNITY)
Admission: RE | Admit: 2024-03-24 | Discharge: 2024-03-24 | Disposition: A | Discharge status: Home / Self Care | Source: Ambulatory Visit

## 2024-03-24 DIAGNOSIS — C642 Malignant neoplasm of left kidney, except renal pelvis: Secondary | ICD-10-CM | POA: Insufficient documentation

## 2024-03-24 DIAGNOSIS — C649 Malignant neoplasm of unspecified kidney, except renal pelvis: Secondary | ICD-10-CM | POA: Diagnosis not present

## 2024-03-24 MED ORDER — GADOBUTROL 1 MMOL/ML IV SOLN
5.0000 mL | Freq: Once | INTRAVENOUS | Status: AC | PRN
  Administered 2024-03-24: 5 mL via INTRAVENOUS

## 2024-03-25 ENCOUNTER — Ambulatory Visit: Payer: Self-pay

## 2024-03-25 NOTE — Telephone Encounter (Signed)
-----   Message from Tammy Hendrix sent at 03/25/2024 11:47 AM EDT ----- Please let her know good news. MRI was clear. No cancer spread to the brain. Thanks  ----- Message ----- From: Interface, Rad Results In Sent: 03/24/2024   5:30 PM EDT To: Tammy JAYSON Chihuahua, MD

## 2024-03-25 NOTE — Telephone Encounter (Signed)
 Pt advised of MRI results with verbal understanding and was very appreciative for the call

## 2024-03-29 ENCOUNTER — Ambulatory Visit

## 2024-03-29 ENCOUNTER — Other Ambulatory Visit

## 2024-03-30 ENCOUNTER — Ambulatory Visit

## 2024-03-30 ENCOUNTER — Ambulatory Visit: Admitting: Nurse Practitioner

## 2024-03-30 ENCOUNTER — Other Ambulatory Visit

## 2024-04-02 ENCOUNTER — Inpatient Hospital Stay

## 2024-04-02 VITALS — BP 126/78 | HR 65 | Temp 97.6°F | Resp 16 | Wt 125.3 lb

## 2024-04-02 DIAGNOSIS — Z79899 Other long term (current) drug therapy: Secondary | ICD-10-CM | POA: Diagnosis not present

## 2024-04-02 DIAGNOSIS — J3089 Other allergic rhinitis: Secondary | ICD-10-CM | POA: Diagnosis not present

## 2024-04-02 DIAGNOSIS — C642 Malignant neoplasm of left kidney, except renal pelvis: Secondary | ICD-10-CM

## 2024-04-02 DIAGNOSIS — J4520 Mild intermittent asthma, uncomplicated: Secondary | ICD-10-CM | POA: Diagnosis not present

## 2024-04-02 DIAGNOSIS — J301 Allergic rhinitis due to pollen: Secondary | ICD-10-CM | POA: Diagnosis not present

## 2024-04-02 DIAGNOSIS — Z85528 Personal history of other malignant neoplasm of kidney: Secondary | ICD-10-CM | POA: Diagnosis not present

## 2024-04-02 LAB — CMP (CANCER CENTER ONLY)
ALT: 11 U/L (ref 0–44)
AST: 17 U/L (ref 15–41)
Albumin: 3.7 g/dL (ref 3.5–5.0)
Alkaline Phosphatase: 60 U/L (ref 38–126)
Anion gap: 6 (ref 5–15)
BUN: 18 mg/dL (ref 6–20)
CO2: 26 mmol/L (ref 22–32)
Calcium: 9.1 mg/dL (ref 8.9–10.3)
Chloride: 105 mmol/L (ref 98–111)
Creatinine: 1.25 mg/dL — ABNORMAL HIGH (ref 0.44–1.00)
GFR, Estimated: 52 mL/min — ABNORMAL LOW (ref >60)
Glucose, Bld: 112 mg/dL — ABNORMAL HIGH (ref 70–99)
Potassium: 4.2 mmol/L (ref 3.5–5.1)
Sodium: 137 mmol/L (ref 135–145)
Total Bilirubin: 0.6 mg/dL (ref 0.0–1.2)
Total Protein: 7.1 g/dL (ref 6.5–8.1)

## 2024-04-02 LAB — CBC WITH DIFFERENTIAL (CANCER CENTER ONLY)
Abs Immature Granulocytes: 0.02 K/uL (ref 0.00–0.07)
Basophils Absolute: 0 K/uL (ref 0.0–0.1)
Basophils Relative: 0 %
Eosinophils Absolute: 0.4 K/uL (ref 0.0–0.5)
Eosinophils Relative: 4 %
HCT: 36.1 % (ref 36.0–46.0)
Hemoglobin: 12.3 g/dL (ref 12.0–15.0)
Immature Granulocytes: 0 %
Lymphocytes Relative: 28 %
Lymphs Abs: 2.7 K/uL (ref 0.7–4.0)
MCH: 32.6 pg (ref 26.0–34.0)
MCHC: 34.1 g/dL (ref 30.0–36.0)
MCV: 95.8 fL (ref 80.0–100.0)
Monocytes Absolute: 0.5 K/uL (ref 0.1–1.0)
Monocytes Relative: 5 %
Neutro Abs: 6 K/uL (ref 1.7–7.7)
Neutrophils Relative %: 63 %
Platelet Count: 438 K/uL — ABNORMAL HIGH (ref 150–400)
RBC: 3.77 MIL/uL — ABNORMAL LOW (ref 3.87–5.11)
RDW: 14.5 % (ref 11.5–15.5)
WBC Count: 9.6 K/uL (ref 4.0–10.5)
nRBC: 0 % (ref 0.0–0.2)

## 2024-04-02 LAB — T4, FREE: Free T4: 0.81 ng/dL (ref 0.61–1.12)

## 2024-04-02 LAB — TSH: TSH: 1.81 u[IU]/mL (ref 0.350–4.500)

## 2024-04-02 MED ORDER — SODIUM CHLORIDE 0.9 % IV SOLN: INTRAVENOUS | Status: DC

## 2024-04-02 MED ORDER — SODIUM CHLORIDE 0.9 % IV SOLN
200.0000 mg | Freq: Once | INTRAVENOUS | Status: AC
  Administered 2024-04-02: 200 mg via INTRAVENOUS
  Filled 2024-04-02: qty 200

## 2024-04-02 NOTE — Patient Instructions (Signed)
 [facility] - A DEPT OF [facility]  Discharge Instructions: Thank you for choosing [facility] to provide your oncology and hematology care.   If you have a lab appointment with the Cancer Center, please go directly to the Cancer Center and check in at the registration area.   Wear comfortable clothing and clothing appropriate for easy access to any Portacath or PICC line.   We strive to give you quality time with your provider. You may need to reschedule your appointment if you arrive late (15 or more minutes).  Arriving late affects you and other patients whose appointments are after yours.  Also, if you miss three or more appointments without notifying the office, you may be dismissed from the clinic at the provider's discretion.      For prescription refill requests, have your pharmacy contact our office and allow 72 hours for refills to be completed.    Today you received the following chemotherapy and/or immunotherapy agents: Keytruda  (Pembrolizumab )      To help prevent nausea and vomiting after your treatment, we encourage you to take your nausea medication as directed.  BELOW ARE SYMPTOMS THAT SHOULD BE REPORTED IMMEDIATELY: *FEVER GREATER THAN 100.4 F (38 C) OR HIGHER *CHILLS OR SWEATING *NAUSEA AND VOMITING THAT IS NOT CONTROLLED WITH YOUR NAUSEA MEDICATION *UNUSUAL SHORTNESS OF BREATH *UNUSUAL BRUISING OR BLEEDING *URINARY PROBLEMS (pain or burning when urinating, or frequent urination) *BOWEL PROBLEMS (unusual diarrhea, constipation, pain near the anus) TENDERNESS IN MOUTH AND THROAT WITH OR WITHOUT PRESENCE OF ULCERS (sore throat, sores in mouth, or a toothache) UNUSUAL RASH, SWELLING OR PAIN  UNUSUAL VAGINAL DISCHARGE OR ITCHING   Items with * indicate a potential emergency and should be followed up as soon as possible or go to the Emergency Department if any problems should occur.  Please show the CHEMOTHERAPY ALERT CARD or  IMMUNOTHERAPY ALERT CARD at check-in to the Emergency Department and triage nurse.  Should you have questions after your visit or need to cancel or reschedule your appointment, please contact [facility] - A DEPT OF [facility]  Dept: [phone]  and follow the prompts.  Office hours are 8:00 a.m. to 4:30 p.m. Monday - Friday. Please note that voicemails left after 4:00 p.m. may not be returned until the following business day.  We are closed weekends and major holidays. You have access to a nurse at all times for urgent questions. Please call the main number to the clinic Dept: [phone] and follow the prompts.   For any non-urgent questions, you may also contact your provider using MyChart. We now offer e-Visits for anyone 62 and older to request care online for non-urgent symptoms. For details visit [email or web address].   Also download the MyChart app! Go to the app store, search MyChart, open the app, select [facility], and log in with your MyChart username and password.  Pembrolizumab  Injection What is this medication? PEMBROLIZUMAB  (PEM broe LIZ ue mab) treats some types of cancer. It works by helping your immune system slow or stop the spread of cancer cells. It is a monoclonal antibody. This medicine may be used for other purposes; ask your health care provider or pharmacist if you have questions. COMMON BRAND NAME(S): Keytruda  What should I tell my care team before I take this medication? They need to know if you have any of these conditions: Allogeneic stem cell transplant (uses someone else's stem  cells) Autoimmune diseases, such as Crohn disease, ulcerative colitis, lupus History of chest radiation Nervous system problems, such as Guillain-Barre syndrome, myasthenia gravis Organ transplant An unusual or allergic reaction to pembrolizumab , other medications, foods, dyes, or preservatives Pregnant or trying to get  pregnant Breast-feeding How should I use this medication? This medication is injected into a vein. It is given by your care team in a hospital or clinic setting. A special MedGuide will be given to you before each treatment. Be sure to read this information carefully each time. Talk to your care team about the use of this medication in children. While it may be prescribed for children as young as 6 months for selected conditions, precautions do apply. Overdosage: If you think you have taken too much of this medicine contact a poison control center or emergency room at once. NOTE: This medicine is only for you. Do not share this medicine with others. What if I miss a dose? Keep appointments for follow-up doses. It is important not to miss your dose. Call your care team if you are unable to keep an appointment. What may interact with this medication? Interactions have not been studied. This list may not describe all possible interactions. Give your health care provider a list of all the medicines, herbs, non-prescription drugs, or dietary supplements you use. Also tell them if you smoke, drink alcohol, or use illegal drugs. Some items may interact with your medicine. What should I watch for while using this medication? Your condition will be monitored carefully while you are receiving this medication. You may need blood work while taking this medication. This medication may cause serious skin reactions. They can happen weeks to months after starting the medication. Contact your care team right away if you notice fevers or flu-like symptoms with a rash. The rash may be red or purple and then turn into blisters or peeling of the skin. You may also notice a red rash with swelling of the face, lips, or lymph nodes in your neck or under your arms. Tell your care team right away if you have any change in your eyesight. Talk to your care team if you may be pregnant. Serious birth defects can occur if you  take this medication during pregnancy and for 4 months after the last dose. You will need a negative pregnancy test before starting this medication. Contraception is recommended while taking this medication and for 4 months after the last dose. Your care team can help you find the option that works for you. Do not breastfeed while taking this medication and for 4 months after the last dose. What side effects may I notice from receiving this medication? Side effects that you should report to your care team as soon as possible: Allergic reactions--skin rash, itching, hives, swelling of the face, lips, tongue, or throat Dry cough, shortness of breath or trouble breathing Eye pain, redness, irritation, or discharge with blurry or decreased vision Heart muscle inflammation--unusual weakness or fatigue, shortness of breath, chest pain, fast or irregular heartbeat, dizziness, swelling of the ankles, feet, or hands Hormone gland problems--headache, sensitivity to light, unusual weakness or fatigue, dizziness, fast or irregular heartbeat, increased sensitivity to cold or heat, excessive sweating, constipation, hair loss, increased thirst or amount of urine, tremors or shaking, irritability Infusion reactions--chest pain, shortness of breath or trouble breathing, feeling faint or lightheaded Kidney injury (glomerulonephritis)--decrease in the amount of urine, red or dark brown urine, foamy or bubbly urine, swelling of the ankles, hands, or  feet Liver injury--right upper belly pain, loss of appetite, nausea, light-colored stool, dark yellow or brown urine, yellowing skin or eyes, unusual weakness or fatigue Pain, tingling, or numbness in the hands or feet, muscle weakness, change in vision, confusion or trouble speaking, loss of balance or coordination, trouble walking, seizures Rash, fever, and swollen lymph nodes Redness, blistering, peeling, or loosening of the skin, including inside the mouth Sudden or  severe stomach pain, bloody diarrhea, fever, nausea, vomiting Side effects that usually do not require medical attention (report to your care team if they continue or are bothersome): Bone, joint, or muscle pain Diarrhea Fatigue Loss of appetite Nausea Skin rash This list may not describe all possible side effects. Call your doctor for medical advice about side effects. You may report side effects to FDA at 1-800-FDA-1088. Where should I keep my medication? This medication is given in a hospital or clinic. It will not be stored at home. NOTE: This sheet is a summary. It may not cover all possible information. If you have questions about this medicine, talk to your doctor, pharmacist, or health care provider.  2024 Elsevier/Gold Standard (2022-01-22 00:00:00)

## 2024-04-05 ENCOUNTER — Telehealth: Payer: Self-pay

## 2024-04-05 NOTE — Telephone Encounter (Signed)
-----   Message from Nurse Almarie A sent at 04/02/2024  4:53 PM EDT ----- Regarding: First timer Dr. Tina- patient tolerated first time keytruda  well. Husband present.

## 2024-04-05 NOTE — Telephone Encounter (Signed)
 LM for patient that this nurse was calling to see how they were doing after their treatment. Please call back to Dr. Nelta Numbers nurse at [phone] if they have any questions or concerns regarding the treatment.

## 2024-04-07 DIAGNOSIS — F4323 Adjustment disorder with mixed anxiety and depressed mood: Secondary | ICD-10-CM | POA: Diagnosis not present

## 2024-04-15 ENCOUNTER — Other Ambulatory Visit: Payer: Self-pay

## 2024-04-17 ENCOUNTER — Other Ambulatory Visit: Payer: Self-pay

## 2024-04-19 ENCOUNTER — Ambulatory Visit

## 2024-04-19 ENCOUNTER — Other Ambulatory Visit

## 2024-04-20 NOTE — Progress Notes (Unsigned)
 [facility] OFFICE PROGRESS NOTE  Patient Care Team: Perri Ronal PARAS, MD as PCP - General (Internal Medicine)  Tammy Hendrix is a 53 y.o.female with unremarkable history being follow up at [facility] for left renal cell carcinoma.   Diagnosis: pT3a pN0 cM0. 9.9 x 6.8 x 8.1 cm G4 LEFT renal clear cell RCC. Treatment: left radical nephrectomy with retroperitoneal lymph node dissection.  04/02/24 C1 adjuvant Pembro   We discussed the standard of care as of today for stage III renal cell carcinoma.  Adjuvant therapy to decrease risk of recurrence is recommended.   Adjuvant pembrolizumab  for 1 year is approved by FDA in this setting as of today.  Per clinical trial from [employer], the estimated overall survival at 4 years was 91.2% in the pembrolizumab  group, as compared with 86.0% in the placebo group; the benefit was consistent across key subgroups. The estimated percentage of participants who were alive and free from recurrence at 48 months were 64.9% in the pembrolizumab  group and 56.6% in placebo group. NEJM 2024 ;[id number].   Side effects of immunotherapy are mostly related to immune related reaction.  They depend on which organ may be affected.  Patient can have hyper or hypothyroidism, skin rash, fever, pneumonitis, hepatitis, carditis, colitis, nephritis or other endorgan damage from immune related reaction.  Severe fatal reaction such as carditis or encephalitis has been reported. Rarely severe side effects can result in death.  After discussion Darlyne understands and would like to proceed.    Will communicate with scheduling on setting up treatment Assessment & Plan   No orders of the defined types were placed in this encounter.    [name], MD  INTERVAL HISTORY: Patient returns for follow-up.  Oncology History  Clear cell renal cell carcinoma, left (HCC)  01/24/2024 Imaging   CT CAP 1. Heterogeneous mass with cystic and solid regions  and calcifications in the mid to upper left kidney measuring 9.9 x 6.8 x 8.1 cm, concerning for renal cell carcinoma. There is moderate hydronephrosis involving the lower pole of the left kidney without evidence of obstructing lesion. There is no definite evidence renal vein thrombosis or lymphadenopathy. 2. Trace right pleural effusion with atelectasis at the lung bases. No pulmonary nodule or mass is seen. 3. Aortic atherosclerosis.   01/27/2024 Initial Diagnosis   Clear cell renal cell carcinoma, left (HCC)   02/19/2024 Pathology Results   LEFT KIDNEY WITH PERIAORTIC LYMPH NODES, RESECTION:  Clear cell renal cell carcinoma, nuclear grade 4, size 9.0 cm  Tumor extends into segmental branches of renal vein, perirenal and renal sinus fat, but not beyond Gerotas fascia (pT3a)  Ureteral, vascular and all margins of resection are negative for tumor   Sarcomatoid Features: Negative  Rhabdoid Features: Negative   Regional Lymph Nodes:       No lymph nodes submitted or found: NA       Number of Lymph Nodes Involved: 0       Number of Lymph Nodes Examined: 2     03/09/2024 Cancer Staging   Staging form: Kidney, AJCC 8th Edition - Clinical: Stage III (cT3a, cN0, cM0) - Signed by Chihuahua Pauletta JAYSON, MD on 03/09/2024 Histologic grade (G): G4 Histologic grading system: 4 grade system   04/02/2024 -  Chemotherapy   Patient is on Treatment Plan : KIDNEY Pembrolizumab  (200) q21d        PHYSICAL EXAMINATION: ECOG PERFORMANCE STATUS: {CHL ONC ECOG PS:[phone]}  There were no vitals filed for this visit.  There were no vitals filed for this visit.  Relevant data reviewed during this visit included ***

## 2024-04-22 NOTE — Assessment & Plan Note (Addendum)
 Solitary kidney Monitor renal function. Fluid 60-70 oz per day

## 2024-04-22 NOTE — Assessment & Plan Note (Addendum)
 Continue adjuvant pembrolizumab  (started on 7/11) Labs, MD visit and treatment every 3 weeks  MRI of brain: NED in 03/2024

## 2024-04-23 ENCOUNTER — Inpatient Hospital Stay

## 2024-04-23 VITALS — BP 120/74 | HR 74 | Temp 97.2°F | Resp 17 | Wt 127.7 lb

## 2024-04-23 DIAGNOSIS — L309 Dermatitis, unspecified: Secondary | ICD-10-CM | POA: Diagnosis not present

## 2024-04-23 DIAGNOSIS — C642 Malignant neoplasm of left kidney, except renal pelvis: Secondary | ICD-10-CM | POA: Diagnosis not present

## 2024-04-23 DIAGNOSIS — Z79899 Other long term (current) drug therapy: Secondary | ICD-10-CM | POA: Diagnosis not present

## 2024-04-23 DIAGNOSIS — Z5112 Encounter for antineoplastic immunotherapy: Secondary | ICD-10-CM | POA: Diagnosis not present

## 2024-04-23 DIAGNOSIS — N1831 Chronic kidney disease, stage 3a: Secondary | ICD-10-CM | POA: Insufficient documentation

## 2024-04-23 DIAGNOSIS — R21 Rash and other nonspecific skin eruption: Secondary | ICD-10-CM | POA: Diagnosis not present

## 2024-04-23 LAB — CBC WITH DIFFERENTIAL (CANCER CENTER ONLY)
Abs Immature Granulocytes: 0.01 K/uL (ref 0.00–0.07)
Basophils Absolute: 0.1 K/uL (ref 0.0–0.1)
Basophils Relative: 1 %
Eosinophils Absolute: 0.5 K/uL (ref 0.0–0.5)
Eosinophils Relative: 5 %
HCT: 34.4 % — ABNORMAL LOW (ref 36.0–46.0)
Hemoglobin: 11.8 g/dL — ABNORMAL LOW (ref 12.0–15.0)
Immature Granulocytes: 0 %
Lymphocytes Relative: 26 %
Lymphs Abs: 2.5 K/uL (ref 0.7–4.0)
MCH: 33.2 pg (ref 26.0–34.0)
MCHC: 34.3 g/dL (ref 30.0–36.0)
MCV: 96.9 fL (ref 80.0–100.0)
Monocytes Absolute: 0.5 K/uL (ref 0.1–1.0)
Monocytes Relative: 5 %
Neutro Abs: 6.2 K/uL (ref 1.7–7.7)
Neutrophils Relative %: 63 %
Platelet Count: 351 K/uL (ref 150–400)
RBC: 3.55 MIL/uL — ABNORMAL LOW (ref 3.87–5.11)
RDW: 14 % (ref 11.5–15.5)
WBC Count: 9.7 K/uL (ref 4.0–10.5)
nRBC: 0 % (ref 0.0–0.2)

## 2024-04-23 LAB — CMP (CANCER CENTER ONLY)
ALT: 11 U/L (ref 0–44)
AST: 15 U/L (ref 15–41)
Albumin: 3.5 g/dL (ref 3.5–5.0)
Alkaline Phosphatase: 46 U/L (ref 38–126)
Anion gap: 7 (ref 5–15)
BUN: 15 mg/dL (ref 6–20)
CO2: 25 mmol/L (ref 22–32)
Calcium: 8.5 mg/dL — ABNORMAL LOW (ref 8.9–10.3)
Chloride: 105 mmol/L (ref 98–111)
Creatinine: 1.2 mg/dL — ABNORMAL HIGH (ref 0.44–1.00)
GFR, Estimated: 54 mL/min — ABNORMAL LOW (ref >60)
Glucose, Bld: 110 mg/dL — ABNORMAL HIGH (ref 70–99)
Potassium: 4.2 mmol/L (ref 3.5–5.1)
Sodium: 137 mmol/L (ref 135–145)
Total Bilirubin: 0.7 mg/dL (ref 0.0–1.2)
Total Protein: 6.6 g/dL (ref 6.5–8.1)

## 2024-04-23 MED ORDER — SODIUM CHLORIDE 0.9 % IV SOLN: INTRAVENOUS | Status: DC

## 2024-04-23 MED ORDER — SODIUM CHLORIDE 0.9 % IV SOLN
200.0000 mg | Freq: Once | INTRAVENOUS | Status: AC
  Administered 2024-04-23: 200 mg via INTRAVENOUS
  Filled 2024-04-23: qty 200

## 2024-04-23 NOTE — Assessment & Plan Note (Addendum)
 Few rash over the right forearm.  Personal history of eczema. hydrocortisone 1% cream twice daily until resolved.

## 2024-04-23 NOTE — Patient Instructions (Signed)

## 2024-04-24 ENCOUNTER — Other Ambulatory Visit: Payer: Self-pay

## 2024-05-03 ENCOUNTER — Other Ambulatory Visit: Payer: Self-pay

## 2024-05-03 ENCOUNTER — Other Ambulatory Visit: Payer: BC Managed Care – PPO

## 2024-05-03 DIAGNOSIS — E785 Hyperlipidemia, unspecified: Secondary | ICD-10-CM | POA: Diagnosis not present

## 2024-05-03 DIAGNOSIS — R718 Other abnormality of red blood cells: Secondary | ICD-10-CM | POA: Diagnosis not present

## 2024-05-03 DIAGNOSIS — M8580 Other specified disorders of bone density and structure, unspecified site: Secondary | ICD-10-CM | POA: Diagnosis not present

## 2024-05-03 DIAGNOSIS — Z780 Asymptomatic menopausal state: Secondary | ICD-10-CM

## 2024-05-03 DIAGNOSIS — Z1329 Encounter for screening for other suspected endocrine disorder: Secondary | ICD-10-CM

## 2024-05-03 DIAGNOSIS — Z0000 Encounter for general adult medical examination without abnormal findings: Secondary | ICD-10-CM

## 2024-05-04 ENCOUNTER — Ambulatory Visit: Payer: Self-pay | Admitting: Internal Medicine

## 2024-05-04 ENCOUNTER — Other Ambulatory Visit: Payer: Self-pay

## 2024-05-04 DIAGNOSIS — R718 Other abnormality of red blood cells: Secondary | ICD-10-CM

## 2024-05-04 LAB — COMPLETE METABOLIC PANEL WITHOUT GFR
AG Ratio: 1.4 (calc) (ref 1.0–2.5)
ALT: 12 U/L (ref 6–29)
AST: 16 U/L (ref 10–35)
Albumin: 3.7 g/dL (ref 3.6–5.1)
Alkaline phosphatase (APISO): 49 U/L (ref 37–153)
BUN/Creatinine Ratio: 13 (calc) (ref 6–22)
BUN: 15 mg/dL (ref 7–25)
CO2: 27 mmol/L (ref 20–32)
Calcium: 8.5 mg/dL — ABNORMAL LOW (ref 8.6–10.4)
Chloride: 102 mmol/L (ref 98–110)
Creat: 1.17 mg/dL — ABNORMAL HIGH (ref 0.50–1.03)
Globulin: 2.6 g/dL (ref 1.9–3.7)
Glucose, Bld: 77 mg/dL (ref 65–99)
Potassium: 4.7 mmol/L (ref 3.5–5.3)
Sodium: 135 mmol/L (ref 135–146)
Total Bilirubin: 0.8 mg/dL (ref 0.2–1.2)
Total Protein: 6.3 g/dL (ref 6.1–8.1)

## 2024-05-04 LAB — LIPID PANEL
Cholesterol: 203 mg/dL — ABNORMAL HIGH (ref <200)
HDL: 71 mg/dL (ref >=50)
LDL Cholesterol (Calc): 107 mg/dL — ABNORMAL HIGH
Non-HDL Cholesterol (Calc): 132 mg/dL — ABNORMAL HIGH (ref <130)
Total CHOL/HDL Ratio: 2.9 (calc) (ref <5.0)
Triglycerides: 142 mg/dL (ref <150)

## 2024-05-04 LAB — TEST AUTHORIZATION

## 2024-05-04 LAB — CBC WITH DIFFERENTIAL/PLATELET
Absolute Lymphocytes: 2579 {cells}/uL (ref 850–3900)
Absolute Monocytes: 596 {cells}/uL (ref 200–950)
Basophils Absolute: 42 {cells}/uL (ref 0–200)
Basophils Relative: 0.5 %
Eosinophils Absolute: 470 {cells}/uL (ref 15–500)
Eosinophils Relative: 5.6 %
HCT: 38.3 % (ref 35.0–45.0)
Hemoglobin: 12.5 g/dL (ref 11.7–15.5)
MCH: 33.2 pg — ABNORMAL HIGH (ref 27.0–33.0)
MCHC: 32.6 g/dL (ref 32.0–36.0)
MCV: 101.9 fL — ABNORMAL HIGH (ref 80.0–100.0)
MPV: 10.1 fL (ref 7.5–12.5)
Monocytes Relative: 7.1 %
Neutro Abs: 4712 {cells}/uL (ref 1500–7800)
Neutrophils Relative %: 56.1 %
Platelets: 371 Thousand/uL (ref 140–400)
RBC: 3.76 Million/uL — ABNORMAL LOW (ref 3.80–5.10)
RDW: 13.8 % (ref 11.0–15.0)
Total Lymphocyte: 30.7 %
WBC: 8.4 Thousand/uL (ref 3.8–10.8)

## 2024-05-04 LAB — VITAMIN B12: Vitamin B-12: 352 pg/mL (ref 200–1100)

## 2024-05-04 LAB — FOLATE: Folate: 14.2 ng/mL

## 2024-05-04 NOTE — Progress Notes (Addendum)
 Annual Wellness Visit   Patient Care Team: Perri Ronal PARAS, MD as PCP - General (Internal Medicine)  Visit Date: 05/06/24   Chief Complaint  Patient presents with   Annual Exam   Subjective:  Patient: Tammy Hendrix, Female DOB: 1971/01/24, 53 y.o. MRN: [id number]  Tammy Hendrix is a 53 y.o. Female who presents today for her Annual Wellness Visit. Patient has Varicose Veins, Bilateral Lower Extremities w/ Other Complications; Osteopenia; Clear Cell Renal Cell Carcinoma, Left; Anemia; and CKD (Chronic Kidney Disease) Stage 3, GFR 30-59 Ml/Min.  History of Anxiety/Depression treated with Xanax  0.5 mg at bedtime as needed.  History of Allergic Rhinitis treated with Zyrtec 10 mg daily and Pataday eye drops; Asthma treated with Albuterol  inhaler and Symbicort inhaler. Followed by Dr. Lamar Fudge, who she last saw 04/02/2024. Allergy testing 2010 positive for: dust mite, trees, grasses, shellfish, peanuts, dogs, cats and feathers.   History of Anemia with RBC 3.76; MCV 101.9, and MCH 33.2, but Hemoglobin WNL at 12.5.  Left Renal Carcinoma dx'ed 01/27/2024 s/p Left Nephrectomy 02/19/2024; subsequent Chronic Kidney Disease, stage III with 2024/05/29 Creatinine 1.17, deceased from 1.25 in 19-Apr-2023. April 19, 2024 Brain MRI did not show any metastasizing. Followed by Dr. [name], Oncologist, who she last saw 04/23/2024. Currently undergoing adjuvant therapy with Pembrolizumab  q21d. Prescribed Zofran  and Compazine  for nausea and Senokot for constipation.  Labs May 29, 2024 CBC w/ Differential, compared to 04/2023: RBC 3.76, decreased from 4.11; MCV 101.9, elevated from 97.8; MCH 33.2, elevated from 33.1; otherwise WNL Comprehensive Metabolic Panel: Creatinine 1.17; Calcium 8.5, elevated from 8.2 in 5/'25, decreased from 9.1 in 7/'25; otherwise WNL Lipid Panel, compared to 04/2023: Cholesterol 203, decreased from 207; LDL 107, decreased from 110; otherwise WNL  Dr. Mat is her gynecologist; PAP Smear  09/06/2022 normal with repeat recommendation of 2026. Bimanual deferred. Uses Estradiol daily tablet and Intrarosa 2-3x/ week for Hormone Replacement Therapy.  Mammogram 05/06/2024 reportedly normal.  Colonoscopy 03/08/2022 removed two 5-8 mm sessile polyps removed from the ascending colon and cecum; Small Non- Bleeding External & Internal Hemorrhoids were found; exam was otherwise without abnormality with repeat recommendation of 2028.  Bone Density 2021 Z-score at [facility] was -1.8, normal within age range.    Vaccine Counseling: Due for Influenza, Shingrix 1st dose, PNA, Hepatitis B, Hepatitis C Screening, and HIV Screening; UTD on Tdap.  Review of Systems  Constitutional:  Negative for chills, fever, malaise/fatigue and weight loss.  HENT:  Negative for hearing loss, sinus pain and sore throat.   Respiratory:  Negative for cough, hemoptysis and shortness of breath.   Cardiovascular:  Negative for chest pain, palpitations, leg swelling and PND.  Gastrointestinal:  Negative for abdominal pain, constipation, diarrhea, heartburn, nausea and vomiting.  Genitourinary:  Negative for dysuria, frequency and urgency.  Musculoskeletal:  Negative for back pain, myalgias and neck pain.  Skin:  Negative for itching and rash.  Neurological:  Negative for dizziness, tingling, seizures and headaches.  Endo/Heme/Allergies:  Negative for polydipsia.  Psychiatric/Behavioral:  Negative for depression. The patient is not nervous/anxious.    Objective:  Vitals: body mass index is 23.22 kg/m. Today's Vitals   05/06/24 1045  BP: 110/80  Pulse: 81  SpO2: 97%  Weight: 128 lb (58.1 kg)  Height: 5' 2.25 (1.581 m)   Physical Exam Vitals and nursing note reviewed.  Constitutional:      General: She is not in acute distress.    Appearance: Normal appearance. She is not ill-appearing or toxic-appearing.  HENT:  Head: Normocephalic and atraumatic.     Right Ear: Hearing, tympanic membrane, ear canal and  external ear normal.     Left Ear: Hearing, tympanic membrane, ear canal and external ear normal.     Mouth/Throat:     Pharynx: Oropharynx is clear.  Eyes:     Extraocular Movements: Extraocular movements intact.     Pupils: Pupils are equal, round, and reactive to light.  Neck:     Thyroid : No thyroid  mass, thyromegaly or thyroid  tenderness.     Vascular: No carotid bruit.  Cardiovascular:     Rate and Rhythm: Normal rate and regular rhythm. No extrasystoles are present.    Pulses:          Dorsalis pedis pulses are 2+ on the right side and 2+ on the left side.     Heart sounds: Normal heart sounds. No murmur heard.    No friction rub. No gallop.  Pulmonary:     Effort: Pulmonary effort is normal.     Breath sounds: Normal breath sounds. No decreased breath sounds, wheezing, rhonchi or rales.  Chest:     Chest wall: No mass.  Abdominal:     Palpations: Abdomen is soft. There is no hepatomegaly, splenomegaly or mass.     Tenderness: There is no abdominal tenderness.     Hernia: No hernia is present.  Musculoskeletal:     Cervical back: Normal range of motion.     Right lower leg: No edema.     Left lower leg: No edema.  Lymphadenopathy:     Cervical: No cervical adenopathy.     Upper Body:     Right upper body: No supraclavicular adenopathy.     Left upper body: No supraclavicular adenopathy.  Skin:    General: Skin is warm and dry.  Neurological:     General: No focal deficit present.     Mental Status: She is alert and oriented to person, place, and time. Mental status is at baseline.     Sensory: Sensation is intact.     Motor: Motor function is intact. No weakness.     Deep Tendon Reflexes: Reflexes are normal and symmetric.  Psychiatric:        Attention and Perception: Attention normal.        Mood and Affect: Mood normal.        Speech: Speech normal.        Behavior: Behavior normal.        Thought Content: Thought content normal.        Cognition and Memory:  Cognition normal.        Judgment: Judgment normal.    Current Outpatient Medications  Medication Instructions   albuterol  (VENTOLIN  HFA) 108 (90 Base) MCG/ACT inhaler 2 puffs, Every 6 hours PRN   ALPRAZolam  (XANAX ) 0.5 mg, Oral, At bedtime PRN   budesonide-formoterol (SYMBICORT) 80-4.5 MCG/ACT inhaler SMARTSIG:By Mouth   clindamycin (CLEOCIN T) 1 % lotion Apply 1 as directed to affected area once a day   desonide (DESOWEN) 0.05 % cream SMARTSIG:sparingly Topical Twice Daily   Norethin-Eth Estradiol-Fe (FEMCON FE,WYMZYA FE,ZENCHENT FE,ZEOSA) 0.4-35 MG-MCG tablet 1 tablet, Daily   Olopatadine HCl (PATADAY) 0.2 % SOLN 1 drop into affected eye   Prasterone (INTRAROSA) 6.5 MG INST Place vaginally. 2-3 X A WEEK   prochlorperazine  (COMPAZINE ) 10 mg, Oral, Every 8 hours PRN   Past Medical History:  Diagnosis Date   Allergy    SEASONAL   Anemia  AFTER 3RD BABY   Asthma    Depression    Dermatitis    Heart murmur    TOLD AS A CHILD HAD MURMUR BUT NO ONE SAID SINCE   Medical/Surgical History Narrative:  Allergic/Intolerant to:  Allergies  Allergen Reactions   Peanuts [Peanut Oil] Rash   Shellfish Allergy Diarrhea   Bacitracin-Polymyxin B Rash   Past Surgical History:  Procedure Laterality Date   COLONOSCOPY     ROBOT ASSISTED LAPAROSCOPIC NEPHRECTOMY Left 02/19/2024   Procedure: NEPHRECTOMY, RADICAL, ROBOT-ASSISTED, LAPAROSCOPIC, ADULT;  Surgeon: Alvaro Ricardo KATHEE Mickey., MD;  Location: [facility];  Service: Urology;  Laterality: Left;   VAGINAL DELIVERY  2001, 2004, 2007   WISDOM TOOTH EXTRACTION     AGE 65 OR 20   Family History  Problem Relation Age of Onset   Hyperlipidemia Mother    Thyroid  disease Mother    Thyroid  cancer Mother 68   Hyperlipidemia Father    Bipolar disorder Father    Neuropathy Father    Colon cancer Neg Hx    Colon polyps Neg Hx    Esophageal cancer Neg Hx    Stomach cancer Neg Hx    Rectal cancer Neg Hx    Family History Narrative: No Family History  of GI Cancers or Colon Polyps Father, a retired Development worker, community, w/ hx of Hyperlipidemia, Neuropathy, Osteoporosis, Bipolar Disorder, Esophagitis and Gastritis  Mother w/ hx of Asthma, Allergic Rhinitis, Hypertension, Hyperlipidemia, Thyroid  Disease, and Thyroid  Cancer onset age 81 Siblings, 2 Brothers & 2 Sisters in good health as far as is known  Children, 1 Son & 2 Daughters in good health as far as is known Social History   Social History Narrative   Married. 3 children, 2 daughters and 1 son. Non-smoker, social alcohol consumption. Homemaker. 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   Most Recent Social Determinants of Health (Including Hx of Tobacco, Alcohol, and Drug Use) SDOH Screenings   Food Insecurity: No Food Insecurity (02/19/2024)  Housing: Low Risk  (02/19/2024)  Transportation Needs: No Transportation Needs (02/19/2024)  Utilities: Not At Risk (02/19/2024)  Depression (PHQ2-9): Low Risk  (05/06/2024)  Tobacco Use: Low Risk  (05/06/2024)   Social History   Tobacco Use   Smoking status: Never   Smokeless tobacco: Never  Vaping Use   Vaping status: Never Used  Substance Use Topics   Alcohol use: Yes    Comment: occas   Drug use: No   Most Recent Functional Status Assessment:    02/19/2024    5:00 PM  In your present state of health, do you have any difficulty performing the following activities:  Hearing? 0  Vision? 0  Difficulty concentrating or making decisions? 0  Doing errands, shopping? 0   Most Recent Fall Risk Assessment:    05/01/2023   12:43 PM  Fall Risk   Falls in the past year? 0  Number falls in past yr: 0  Injury with Fall? 0  Risk for fall due to : No Fall Risks  Follow up Falls evaluation completed   Most Recent Anxiety/Depression Screenings:    05/06/2024   10:53 AM 05/01/2023   12:44 PM  PHQ 2/9 Scores  PHQ - 2 Score 0 0   Most Recent Vision/Hearing Screenings:No results found. Results:  Studies Obtained  And Personally Reviewed By Me:  CT ABDOMEN AND PELVIS WITHOUT CONTRAST 01/24/2024   FINDINGS: Lower chest: No acute abnormality.   Hepatobiliary: No focal liver abnormality  is seen. No gallstones, gallbladder wall thickening, or biliary dilatation.   Pancreas: Unremarkable. No pancreatic ductal dilatation or surrounding inflammatory changes.   Spleen: Normal in size without focal abnormality.   Adrenals/Urinary Tract: Normal appearance of the right adrenal gland and right kidney. There is a large, heterogeneous mass involving the upper pole of the left kidney. This measures 8.3 x 9.5 by 7.3 cm,, image 23/3 and image 81/7. The tumor extends into the surrounding left upper quadrant soft tissues abutting the spleen, tail of pancreas and undersurface of the posterior left hemidiaphragm. Soft tissue and fluid components as well as scattered calcifications noted within this mass. There is left-sided hydronephrosis and hydroureter. No obstructing stone identified. Urinary bladder appears normal for degree of distension.   Stomach/Bowel: Stomach is normal. The appendix is visualized and is within normal limits. No pathologic dilatation of the large or small bowel loops.   Vascular/Lymphatic: Aortic atherosclerosis. No signs of abdominopelvic adenopathy.   Reproductive: Uterus and bilateral adnexa are unremarkable.   Other: No free fluid or fluid collections.   Musculoskeletal: No acute or suspicious osseous findings.   IMPRESSION: 1. There is a large, heterogeneous mass involving the upper pole of the left kidney measuring 8.3 x 9.5 x 7.3 cm. The mass extends into the surrounding left upper quadrant soft tissues abutting the spleen, tail of pancreas and undersurface of the posterior left hemidiaphragm. Findings are worrisome for primary renal cell carcinoma. Advise more definitive characterization with dedicated renal mass protocol CT or MRI.  2. Left-sided hydronephrosis and  hydroureter. No obstructing stone identified.  3.  Aortic Atherosclerosis   CT CHEST, ABDOMEN, AND PELVIS WITH CONTRAST 01/24/2024  COMPARISON:  01/24/2024.   FINDINGS: CT CHEST FINDINGS   Cardiovascular: The heart is normal in size and there is no pericardial effusion. The aorta and pulmonary trunk are normal in caliber.   Mediastinum/Nodes: No mediastinal, hilar, or axillary lymphadenopathy. Subcentimeter hypodensities are present in the left lobe of the thyroid  gland. No additional imaging is recommended. The trachea and esophagus are within normal limits.   Lungs/Pleura: There is a trace right pleural effusion. Atelectasis is noted bilaterally. No consolidation or pneumothorax is seen. No pulmonary nodule or mass is seen.   Musculoskeletal: No acute or suspicious osseous abnormality is seen.   CT ABDOMEN PELVIS FINDINGS   Hepatobiliary: No focal liver abnormality is seen. No gallstones, gallbladder wall thickening, or biliary dilatation.   Pancreas: Unremarkable. No pancreatic ductal dilatation or surrounding inflammatory changes.   Spleen: Normal in size without focal abnormality.   Adrenals/Urinary Tract: The right adrenal gland is within normal limits. The left adrenal gland is not well seen. There is a heterogeneous cystic and solid mass in the mid to upper left kidney measuring 9.9 x 6.8 x 8.1 cm containing a few calcifications. The tumor abuts of the spleen and tail of the pancreas. There is mild-to-moderate hydronephrosis involving in the lower pole of the left kidney without evidence obstructing lesion. No renal calculus or obstructive uropathy on the right. The bladder is unremarkable. No obvious renal vein thrombosis is seen. Multiple varices are noted in the perirenal space on the left.   Stomach/Bowel: Stomach is within normal limits. Appendix appears normal. No evidence of bowel wall thickening, distention, or inflammatory changes. No free air or  pneumatosis is seen.   Vascular/Lymphatic: Aortic atherosclerosis. No enlarged abdominal or pelvic lymph nodes.   Reproductive: Uterus and bilateral adnexa are unremarkable.   Other: Left perinephric edema and a small amount  of free fluid is noted.   Musculoskeletal: No acute or suspicious osseous abnormality.   IMPRESSION: 1. Heterogeneous mass with cystic and solid regions and calcifications in the mid to upper left kidney measuring 9.9 x 6.8 x 8.1 cm, concerning for renal cell carcinoma. There is moderate hydronephrosis involving the lower pole of the left kidney without evidence of obstructing lesion. There is no definite evidence renal vein thrombosis or lymphadenopathy.  2. Trace right pleural effusion with atelectasis at the lung bases.  3. Aortic atherosclerosis.   MRI HEAD WITHOUT AND WITH CONTRAST 03/24/2024  FINDINGS: Brain:   Cerebral volume is normal.   No cortical encephalomalacia is identified. No significant cerebral white matter disease.   There is no acute infarct.   No evidence of an intracranial mass.   No chronic intracranial blood products.   No extra-axial fluid collection.   No midline shift.   No pathologic intracranial enhancement identified.   Vascular: Maintained flow voids within the proximal large arterial vessels.   Skull and upper cervical spine: No focal worrisome marrow lesion.   Sinuses/Orbits: No mass or acute finding within the imaged orbits. No significant paranasal sinus disease.   IMPRESSION: Unremarkable MRI appearance of the brain. No evidence of intracranial metastatic disease.   PAP Smear 09/06/2022 normal.  Mammogram 05/06/2024 reportedly normal.  Colonoscopy 03/08/2022 removed two 5-8 mm sessile polyps removed from the ascending colon and cecum; Small Non- Bleeding External & Internal Hemorrhoids were found; exam was otherwise without abnormality.  Bone Density 2021 Z-score at [facility] was -1.8, normal within  age range.    Labs:  CBC w/ Differential Lab Results  Component Value Date   WBC 8.4 05/03/2024   RBC 3.76 (L) 05/03/2024   HGB 12.5 05/03/2024   HCT 38.3 05/03/2024   PLT 371 05/03/2024   MCV 101.9 (H) 05/03/2024   MCH 33.2 (H) 05/03/2024   MCHC 32.6 05/03/2024   RDW 13.8 05/03/2024   MPV 10.1 05/03/2024   LYMPHSABS 2.5 04/23/2024   MONOABS 0.5 04/23/2024   BASOSABS 42 05/03/2024    Comprehensive Metabolic Panel Lab Results  Component Value Date   NA 135 05/03/2024   K 4.7 05/03/2024   CL 102 05/03/2024   CO2 27 05/03/2024   GLUCOSE 77 05/03/2024   BUN 15 05/03/2024   CREATININE 1.17 (H) 05/03/2024   CALCIUM 8.5 (L) 05/03/2024   PROT 6.3 05/03/2024   ALBUMIN 3.5 04/23/2024   AST 16 05/03/2024   ALT 12 05/03/2024   ALKPHOS 46 04/23/2024   BILITOT 0.8 05/03/2024   EGFR 89 04/28/2023   GFRNONAA 54 (L) 04/23/2024   Lipid Panel  Lab Results  Component Value Date   CHOL 203 (H) 05/03/2024   HDL 71 05/03/2024   LDLCALC 107 (H) 05/03/2024   TRIG 142 05/03/2024   TSH Lab Results  Component Value Date   TSH 1.810 04/02/2024   Urinalysis POCT URINALYSIS DIP (CLINITEK)  Result Value Ref Range  Color, UA yellow yellow  Clarity, UA clear clear  Glucose, UA negative negative mg/dL  Bilirubin, UA negative negative  Ketones, POC UA negative negative mg/dL  Spec Grav, UA 8.989 8.989 - 1.025  Blood, UA small (A) negative  pH, UA 6.5 5.0 - 8.0  POC PROTEIN,UA negative negative, trace  Urobilinogen, UA 0.2 0.2 or 1.0 E.U./dL  Nitrite, UA Negative Negative  Leukocytes, UA Moderate (2+) (A) Negative   Assessment & Plan:   Orders Placed This Encounter  Procedures   Urine Culture  POCT URINALYSIS DIP (CLINITEK)  Physical Exam, Annual  Other Labs Reviewed today: CBC w/ Differential, compared to 04/2023: RBC 3.76, decreased from 4.11; MCV 101.9, elevated from 97.8; MCH 33.2, elevated from 33.1; otherwise WNL Comprehensive Metabolic Panel: Creatinine 1.17; Calcium  8.5, elevated from 8.2 in 5/'25, decreased from 9.1 in 7/'25; otherwise WNL Lipid Panel, compared to 04/2023: Cholesterol 203, decreased from 207; LDL 107, decreased from 110; otherwise WNL  Leukocytes In Urine: UA today moderately leukocytic w/ small amounts of occult blood.  Sending UA for culture.  Anxiety/Depression treated with Xanax  0.5 mg at bedtime as needed.  Allergic Rhinitis treated with Zyrtec 10 mg daily and Pataday eye drops; Asthma treated with Albuterol  inhaler and Symbicort inhaler. Followed by Dr. Lamar Fudge, who she last saw 04/02/2024. Allergy testing 2010 positive for: dust mite, trees, grasses, shellfish, peanuts, dogs, cats and feathers.   Anemia with RBC 3.76; MCV 101.9, and MCH 33.2, but Hemoglobin WNL at 12.5.  Left Renal Carcinoma dx'ed 01/27/2024 s/p Left Nephrectomy 02/19/2024; subsequent Chronic Kidney Disease, stage III with 2024-05-14 Creatinine 1.17, deceased from 1.25 in 04-11-2023. April 04, 2024 Brain MRI did not show any metastasizing. Followed by Dr. [name], Oncologist, who she last saw 04/23/2024. Currently undergoing adjuvant therapy with Pembrolizumab  q21d. Prescribed Zofran  and Compazine  for nausea and Senokot for constipation.  Dr. Mat is her gynecologist; PAP Smear 09/06/2022 normal with repeat recommendation of 2026. Bimanual deferred.  Uses Estradiol daily tablet and Intrarosa 2-3x/ week for Hormone Replacement Therapy.  Mammogram 05/06/2024 reportedly normal.  Colonoscopy 03/08/2022 removed two 5-8 mm sessile polyps removed from the ascending colon and cecum; Small Non- Bleeding External & Internal Hemorrhoids were found; exam was otherwise without abnormality with repeat recommendation of 2028.  Bone Density 2021 Z-score at [facility] was -1.8, normal within age range.    Vaccine Counseling: Due for Influenza, Shingrix 1st dose, PNA, Hepatitis B, Hepatitis C Screening, and HIV Screening; UTD on Tdap.  Return in about 1 year (around 05/07/2025) for  annual labs, annual visit, or as needed.   Annual Wellness Visit done today including the all of the following: Reviewed patient's Family Medical History Reviewed patient's SDOH and reviewed tobacco, alcohol, and drug use.  Reviewed and updated list of patient's medical providers Assessment of cognitive impairment was done Assessed patient's functional ability Established a written schedule for health screening services Health Risk Assessent Completed and Reviewed  Discussed health benefits of physical activity, and encouraged her to engage in regular exercise appropriate for her age and condition.   I,Emily Lagle,acting as a Neurosurgeon for Ronal JINNY Hailstone, MD.,have documented all relevant documentation on the behalf of Ronal JINNY Hailstone, MD,as directed by  Ronal JINNY Hailstone, MD while in the presence of Ronal JINNY Hailstone, MD.  I, Ronal JINNY Hailstone, MD, have reviewed all documentation for this visit. The documentation on 05/06/2024 for the exam, diagnosis, procedures, and orders are all accurate and complete.  Ronal JINNY Hailstone, MD. Internal Medicine 05/06/2024

## 2024-05-06 ENCOUNTER — Ambulatory Visit: Payer: BC Managed Care – PPO | Admitting: Internal Medicine

## 2024-05-06 ENCOUNTER — Encounter: Payer: Self-pay | Admitting: Internal Medicine

## 2024-05-06 ENCOUNTER — Ambulatory Visit: Payer: Self-pay | Admitting: Internal Medicine

## 2024-05-06 VITALS — BP 110/80 | HR 81 | Ht 62.25 in | Wt 128.0 lb

## 2024-05-06 DIAGNOSIS — J4520 Mild intermittent asthma, uncomplicated: Secondary | ICD-10-CM | POA: Diagnosis not present

## 2024-05-06 DIAGNOSIS — J3081 Allergic rhinitis due to animal (cat) (dog) hair and dander: Secondary | ICD-10-CM

## 2024-05-06 DIAGNOSIS — M8580 Other specified disorders of bone density and structure, unspecified site: Secondary | ICD-10-CM | POA: Diagnosis not present

## 2024-05-06 DIAGNOSIS — J301 Allergic rhinitis due to pollen: Secondary | ICD-10-CM

## 2024-05-06 DIAGNOSIS — Z0000 Encounter for general adult medical examination without abnormal findings: Secondary | ICD-10-CM | POA: Diagnosis not present

## 2024-05-06 DIAGNOSIS — J3089 Other allergic rhinitis: Secondary | ICD-10-CM

## 2024-05-06 DIAGNOSIS — F5105 Insomnia due to other mental disorder: Secondary | ICD-10-CM

## 2024-05-06 DIAGNOSIS — Z905 Acquired absence of kidney: Secondary | ICD-10-CM

## 2024-05-06 DIAGNOSIS — J305 Allergic rhinitis due to food: Secondary | ICD-10-CM

## 2024-05-06 DIAGNOSIS — C642 Malignant neoplasm of left kidney, except renal pelvis: Secondary | ICD-10-CM

## 2024-05-06 DIAGNOSIS — R82998 Other abnormal findings in urine: Secondary | ICD-10-CM | POA: Diagnosis not present

## 2024-05-06 DIAGNOSIS — F409 Phobic anxiety disorder, unspecified: Secondary | ICD-10-CM

## 2024-05-06 LAB — POCT URINALYSIS DIP (CLINITEK)
Bilirubin, UA: NEGATIVE
Glucose, UA: NEGATIVE mg/dL
Ketones, POC UA: NEGATIVE mg/dL
Nitrite, UA: NEGATIVE
POC PROTEIN,UA: NEGATIVE
Spec Grav, UA: 1.01 (ref 1.010–1.025)
Urobilinogen, UA: 0.2 U/dL
pH, UA: 6.5 (ref 5.0–8.0)

## 2024-05-07 LAB — URINE CULTURE
MICRO NUMBER:: 16831843
SPECIMEN QUALITY:: ADEQUATE

## 2024-05-10 ENCOUNTER — Ambulatory Visit

## 2024-05-10 ENCOUNTER — Other Ambulatory Visit

## 2024-05-13 NOTE — Assessment & Plan Note (Addendum)
 Few rash over the right forearm.  Personal history of eczema. hydrocortisone 1% cream twice daily until resolved.

## 2024-05-13 NOTE — Assessment & Plan Note (Addendum)
 Continue adjuvant pembrolizumab  (started on 7/11) Labs, MD visit and treatment every 3 weeks  MRI of brain: NED in 03/2024

## 2024-05-13 NOTE — Progress Notes (Unsigned)
 [facility] OFFICE PROGRESS NOTE  Patient Care Team: Perri Ronal PARAS, MD as PCP - General (Internal Medicine)  [name] is a 53 y.o.female with unremarkable history being [facility] for left renal cell carcinoma.   Diagnosis: pT3a pN0 cM0. 9.9 x 6.8 x 8.1 cm G4 LEFT renal clear cell RCC. Treatment: left radical nephrectomy with retroperitoneal lymph node dissection.  04/02/24 C1 adjuvant Pembro  Rash over lower back/pelvic area.  Recommend start desonide twice daily consistently  Discussed healthy diet, may improve activity of immunotherapy. Assessment & Plan Clear cell renal cell carcinoma, left (HCC) Continue adjuvant pembrolizumab  (started on 7/11) Labs, MD visit and treatment every 3 weeks  MRI of brain: NED in 03/2024 Rash Minimal rash over the entire cubital area and reported lower pelvis and the back.  Personal history of eczema. Desonide cream 0.05% twice daily until resolved. Osteopenia, unspecified location Recommend vitamin D  and calcium daily.    Tammy JAYSON Chihuahua, MD  INTERVAL HISTORY: Patient returns for follow-up.  Overall tolerating treatment.  Some rash over the lower back of the pelvis.  She has desonide cream from her other provider.  No diffuse rash.  No coughing, diarrhea, shortness of breath, headaches, or other symptoms.  Reports some tiredness in general.  Wondering if she should resume B12.  She is to please take B12.  She also needs to take vitamin D  50  Oncology History  Clear cell renal cell carcinoma, left (HCC)  01/24/2024 Imaging   CT CAP 1. Heterogeneous mass with cystic and solid regions and calcifications in the mid to upper left kidney measuring 9.9 x 6.8 x 8.1 cm, concerning for renal cell carcinoma. There is moderate hydronephrosis involving the lower pole of the left kidney without evidence of obstructing lesion. There is no definite evidence renal vein thrombosis or lymphadenopathy. 2. Trace right pleural  effusion with atelectasis at the lung bases. No pulmonary nodule or mass is seen. 3. Aortic atherosclerosis.   01/27/2024 Initial Diagnosis   Clear cell renal cell carcinoma, left (HCC)   02/19/2024 Pathology Results   LEFT KIDNEY WITH PERIAORTIC LYMPH NODES, RESECTION:  Clear cell renal cell carcinoma, nuclear grade 4, size 9.0 cm  Tumor extends into segmental branches of renal vein, perirenal and renal sinus fat, but not beyond Gerotas fascia (pT3a)  Ureteral, vascular and all margins of resection are negative for tumor   Sarcomatoid Features: Negative  Rhabdoid Features: Negative   Regional Lymph Nodes:       No lymph nodes submitted or found: NA       Number of Lymph Nodes Involved: 0       Number of Lymph Nodes Examined: 2     03/09/2024 Cancer Staging   Staging form: Kidney, AJCC 8th Edition - Clinical: Stage III (cT3a, cN0, cM0) - Signed by Hendrix Tammy JAYSON, MD on 03/09/2024 Histologic grade (G): G4 Histologic grading system: 4 grade system   04/02/2024 -  Chemotherapy   Patient is on Treatment Plan : KIDNEY Pembrolizumab  (200) q21d        PHYSICAL EXAMINATION: ECOG PERFORMANCE STATUS: 0 - Asymptomatic  Vitals:   05/14/24 0947  BP: 117/72  Pulse: 65  Resp: 17  Temp: (!) 97 F (36.1 C)  SpO2: 99%   Filed Weights   05/14/24 0947  Weight: 130 lb 1.6 oz (59 kg)    GENERAL: alert, no distress and comfortable SKIN: skin color normal.  Minimal light patch antecubital area EYES:  sclera  clear LUNGS: clear to auscultation and percussion with normal breathing effort HEART: regular rate & rhythm  ABDOMEN: abdomen soft, non-tender and nondistended. Musculoskeletal: no edema  Relevant data reviewed during this visit included labs.

## 2024-05-14 ENCOUNTER — Other Ambulatory Visit

## 2024-05-14 ENCOUNTER — Inpatient Hospital Stay

## 2024-05-14 ENCOUNTER — Inpatient Hospital Stay (HOSPITAL_BASED_OUTPATIENT_CLINIC_OR_DEPARTMENT_OTHER)

## 2024-05-14 VITALS — BP 117/72 | HR 65 | Temp 97.0°F | Resp 17 | Ht 62.0 in | Wt 130.1 lb

## 2024-05-14 DIAGNOSIS — M8580 Other specified disorders of bone density and structure, unspecified site: Secondary | ICD-10-CM

## 2024-05-14 DIAGNOSIS — R21 Rash and other nonspecific skin eruption: Secondary | ICD-10-CM | POA: Diagnosis not present

## 2024-05-14 DIAGNOSIS — C642 Malignant neoplasm of left kidney, except renal pelvis: Secondary | ICD-10-CM

## 2024-05-14 DIAGNOSIS — N1831 Chronic kidney disease, stage 3a: Secondary | ICD-10-CM | POA: Diagnosis not present

## 2024-05-14 DIAGNOSIS — Z79899 Other long term (current) drug therapy: Secondary | ICD-10-CM | POA: Diagnosis not present

## 2024-05-14 DIAGNOSIS — Z5112 Encounter for antineoplastic immunotherapy: Secondary | ICD-10-CM | POA: Diagnosis not present

## 2024-05-14 DIAGNOSIS — L309 Dermatitis, unspecified: Secondary | ICD-10-CM | POA: Diagnosis not present

## 2024-05-14 LAB — CBC WITH DIFFERENTIAL (CANCER CENTER ONLY)
Abs Immature Granulocytes: 0.01 K/uL (ref 0.00–0.07)
Basophils Absolute: 0.1 K/uL (ref 0.0–0.1)
Basophils Relative: 1 %
Eosinophils Absolute: 0.5 K/uL (ref 0.0–0.5)
Eosinophils Relative: 6 %
HCT: 37.1 % (ref 36.0–46.0)
Hemoglobin: 12.5 g/dL (ref 12.0–15.0)
Immature Granulocytes: 0 %
Lymphocytes Relative: 28 %
Lymphs Abs: 2.1 K/uL (ref 0.7–4.0)
MCH: 33.2 pg (ref 26.0–34.0)
MCHC: 33.7 g/dL (ref 30.0–36.0)
MCV: 98.4 fL (ref 80.0–100.0)
Monocytes Absolute: 0.5 K/uL (ref 0.1–1.0)
Monocytes Relative: 7 %
Neutro Abs: 4.4 K/uL (ref 1.7–7.7)
Neutrophils Relative %: 58 %
Platelet Count: 350 K/uL (ref 150–400)
RBC: 3.77 MIL/uL — ABNORMAL LOW (ref 3.87–5.11)
RDW: 13 % (ref 11.5–15.5)
WBC Count: 7.6 K/uL (ref 4.0–10.5)
nRBC: 0 % (ref 0.0–0.2)

## 2024-05-14 LAB — CMP (CANCER CENTER ONLY)
ALT: 11 U/L (ref 0–44)
AST: 16 U/L (ref 15–41)
Albumin: 3.5 g/dL (ref 3.5–5.0)
Alkaline Phosphatase: 49 U/L (ref 38–126)
Anion gap: 5 (ref 5–15)
BUN: 16 mg/dL (ref 6–20)
CO2: 25 mmol/L (ref 22–32)
Calcium: 8.7 mg/dL — ABNORMAL LOW (ref 8.9–10.3)
Chloride: 105 mmol/L (ref 98–111)
Creatinine: 1.07 mg/dL — ABNORMAL HIGH (ref 0.44–1.00)
GFR, Estimated: >60 mL/min (ref >60)
Glucose, Bld: 64 mg/dL — ABNORMAL LOW (ref 70–99)
Potassium: 4.6 mmol/L (ref 3.5–5.1)
Sodium: 135 mmol/L (ref 135–145)
Total Bilirubin: 0.6 mg/dL (ref 0.0–1.2)
Total Protein: 6.5 g/dL (ref 6.5–8.1)

## 2024-05-14 LAB — TSH: TSH: 3.04 u[IU]/mL (ref 0.350–4.500)

## 2024-05-14 LAB — T4, FREE: Free T4: 0.72 ng/dL (ref 0.61–1.12)

## 2024-05-14 MED ORDER — SODIUM CHLORIDE 0.9 % IV SOLN: INTRAVENOUS | Status: DC

## 2024-05-14 MED ORDER — SODIUM CHLORIDE 0.9 % IV SOLN
200.0000 mg | Freq: Once | INTRAVENOUS | Status: AC
  Administered 2024-05-14: 200 mg via INTRAVENOUS
  Filled 2024-05-14: qty 200

## 2024-05-14 NOTE — Assessment & Plan Note (Addendum)
 Recommend vitamin D  and calcium daily.

## 2024-05-16 ENCOUNTER — Other Ambulatory Visit: Payer: Self-pay

## 2024-05-18 DIAGNOSIS — M79671 Pain in right foot: Secondary | ICD-10-CM | POA: Diagnosis not present

## 2024-05-21 NOTE — Patient Instructions (Signed)
 It was a pleasure to see you today. So glad you are doing well. Please return for annual exam in one year or as needed. Please call  me anytime if you have questions or concerns.

## 2024-06-04 ENCOUNTER — Ambulatory Visit

## 2024-06-04 ENCOUNTER — Other Ambulatory Visit

## 2024-06-07 NOTE — Assessment & Plan Note (Addendum)
 Continue adjuvant pembrolizumab  (started on 7/11) Labs, MD visit and treatment every 3 weeks  MRI of brain: NED in 03/2024

## 2024-06-07 NOTE — Progress Notes (Unsigned)
 [facility] OFFICE PROGRESS NOTE  Patient Care Team: Tammy Ronal PARAS, MD as PCP - General (Internal Medicine)  Tammy Tammy is a 53 y.o.female with unremarkable history being follow up at [facility] for left renal cell carcinoma.   Diagnosis: pT3a pN0 cM0. 9.9 x 6.8 x 8.1 cm G4 LEFT renal clear cell RCC. Treatment: left radical nephrectomy with retroperitoneal lymph node dissection.  04/02/24 C1 adjuvant Pembro   Rash over lower calf area.  History of eczema comes and goes.  No worsening.  Recommend continue desonide twice daily consistently Assessment & Plan Clear cell renal cell carcinoma, left (HCC) Continue adjuvant pembrolizumab  (started on 7/11) Labs, MD visit and treatment every 3 weeks  MRI of brain: NED in 03/2024 Plan on CT with Dr. Alvaro later this year. Low serum vitamin B12 MCV improved after B12. Continue B12 supplement. Rash Minimal at left calf.  Personal history of eczema. Desonide cream 0.05% twice daily until resolved.    Tammy Hendrix Chihuahua, MD  INTERVAL HISTORY: Patient returns for follow-up. No coughing, short of breath, joint swelling, stomach pain, persistent diarrhea. No new headache, neurologic weakness.  History of eczema that comes and go.  No worsening rash.  1 area over the left lower calf.  Using desonide.  Oncology History  Clear cell renal cell carcinoma, left (HCC)  01/24/2024 Imaging   CT CAP 1. Heterogeneous mass with cystic and solid regions and calcifications in the mid to upper left kidney measuring 9.9 x 6.8 x 8.1 cm, concerning for renal cell carcinoma. There is moderate hydronephrosis involving the lower pole of the left kidney without evidence of obstructing lesion. There is no definite evidence renal vein thrombosis or lymphadenopathy. 2. Trace right pleural effusion with atelectasis at the lung bases. No pulmonary nodule or mass is seen. 3. Aortic atherosclerosis.   01/27/2024 Initial Diagnosis   Clear cell renal  cell carcinoma, left (HCC)   02/19/2024 Pathology Results   LEFT KIDNEY WITH PERIAORTIC LYMPH NODES, RESECTION:  Clear cell renal cell carcinoma, nuclear grade 4, size 9.0 cm  Tumor extends into segmental branches of renal vein, perirenal and renal sinus fat, but not beyond Gerotas fascia (pT3a)  Ureteral, vascular and all margins of resection are negative for tumor   Sarcomatoid Features: Negative  Rhabdoid Features: Negative   Regional Lymph Nodes:       No lymph nodes submitted or found: NA       Number of Lymph Nodes Involved: 0       Number of Lymph Nodes Examined: 2     03/09/2024 Cancer Staging   Staging form: Kidney, AJCC 8th Edition - Clinical: Stage III (cT3a, cN0, cM0) - Signed by Tammy Tammy JAYSON, MD on 03/09/2024 Histologic grade (G): G4 Histologic grading system: 4 grade system   04/02/2024 -  Chemotherapy   Patient is on Treatment Plan : KIDNEY Pembrolizumab  (200) q21d        PHYSICAL EXAMINATION: ECOG PERFORMANCE STATUS: 0 - Asymptomatic  Vitals:   06/08/24 0902  BP: 124/71  Pulse: 62  Resp: 20  Temp: (!) 97 F (36.1 C)  SpO2: 100%   Filed Weights   06/08/24 0902  Weight: 130 lb 6.4 oz (59.1 kg)   GENERAL: alert, no distress and comfortable SKIN: skin color normal and no jaundice Patchy rash over left lateral calf EYES: sclera clear LUNGS: clear to auscultation and percussion with normal breathing effort HEART: regular rate & rhythm  ABDOMEN: abdomen soft, non-tender and nondistended. Musculoskeletal:  no edema   Relevant data reviewed during this visit included labs.

## 2024-06-08 ENCOUNTER — Inpatient Hospital Stay

## 2024-06-08 ENCOUNTER — Inpatient Hospital Stay (HOSPITAL_BASED_OUTPATIENT_CLINIC_OR_DEPARTMENT_OTHER)

## 2024-06-08 VITALS — BP 124/71 | HR 62 | Temp 97.0°F | Resp 20 | Wt 130.4 lb

## 2024-06-08 DIAGNOSIS — C642 Malignant neoplasm of left kidney, except renal pelvis: Secondary | ICD-10-CM | POA: Diagnosis not present

## 2024-06-08 DIAGNOSIS — R21 Rash and other nonspecific skin eruption: Secondary | ICD-10-CM | POA: Diagnosis not present

## 2024-06-08 DIAGNOSIS — Z5112 Encounter for antineoplastic immunotherapy: Secondary | ICD-10-CM | POA: Insufficient documentation

## 2024-06-08 DIAGNOSIS — E538 Deficiency of other specified B group vitamins: Secondary | ICD-10-CM | POA: Diagnosis not present

## 2024-06-08 DIAGNOSIS — Z79899 Other long term (current) drug therapy: Secondary | ICD-10-CM | POA: Diagnosis not present

## 2024-06-08 LAB — CBC WITH DIFFERENTIAL (CANCER CENTER ONLY)
Abs Immature Granulocytes: 0.01 K/uL (ref 0.00–0.07)
Basophils Absolute: 0.1 K/uL (ref 0.0–0.1)
Basophils Relative: 1 %
Eosinophils Absolute: 0.5 K/uL (ref 0.0–0.5)
Eosinophils Relative: 6 %
HCT: 37.1 % (ref 36.0–46.0)
Hemoglobin: 12.9 g/dL (ref 12.0–15.0)
Immature Granulocytes: 0 %
Lymphocytes Relative: 25 %
Lymphs Abs: 2.2 K/uL (ref 0.7–4.0)
MCH: 34 pg (ref 26.0–34.0)
MCHC: 34.8 g/dL (ref 30.0–36.0)
MCV: 97.9 fL (ref 80.0–100.0)
Monocytes Absolute: 0.5 K/uL (ref 0.1–1.0)
Monocytes Relative: 6 %
Neutro Abs: 5.5 K/uL (ref 1.7–7.7)
Neutrophils Relative %: 62 %
Platelet Count: 348 K/uL (ref 150–400)
RBC: 3.79 MIL/uL — ABNORMAL LOW (ref 3.87–5.11)
RDW: 12 % (ref 11.5–15.5)
WBC Count: 8.7 K/uL (ref 4.0–10.5)
nRBC: 0 % (ref 0.0–0.2)

## 2024-06-08 LAB — CMP (CANCER CENTER ONLY)
ALT: 10 U/L (ref 0–44)
AST: 15 U/L (ref 15–41)
Albumin: 3.4 g/dL — ABNORMAL LOW (ref 3.5–5.0)
Alkaline Phosphatase: 48 U/L (ref 38–126)
Anion gap: 7 (ref 5–15)
BUN: 19 mg/dL (ref 6–20)
CO2: 23 mmol/L (ref 22–32)
Calcium: 8.4 mg/dL — ABNORMAL LOW (ref 8.9–10.3)
Chloride: 107 mmol/L (ref 98–111)
Creatinine: 1.21 mg/dL — ABNORMAL HIGH (ref 0.44–1.00)
GFR, Estimated: 54 mL/min — ABNORMAL LOW (ref >60)
Glucose, Bld: 76 mg/dL (ref 70–99)
Potassium: 4.3 mmol/L (ref 3.5–5.1)
Sodium: 137 mmol/L (ref 135–145)
Total Bilirubin: 0.6 mg/dL (ref 0.0–1.2)
Total Protein: 6.4 g/dL — ABNORMAL LOW (ref 6.5–8.1)

## 2024-06-08 MED ORDER — SODIUM CHLORIDE 0.9 % IV SOLN: INTRAVENOUS | Status: DC

## 2024-06-08 MED ORDER — SODIUM CHLORIDE 0.9 % IV SOLN
200.0000 mg | Freq: Once | INTRAVENOUS | Status: AC
  Administered 2024-06-08: 200 mg via INTRAVENOUS
  Filled 2024-06-08: qty 200

## 2024-06-08 NOTE — Patient Instructions (Signed)
 [facility] - A DEPT OF [facility]  Discharge Instructions: Thank you for choosing [facility] to provide your oncology and hematology care.   If you have a lab appointment with the Cancer Center, please go directly to the Cancer Center and check in at the registration area.   Wear comfortable clothing and clothing appropriate for easy access to any Portacath or PICC line.   We strive to give you quality time with your provider. You may need to reschedule your appointment if you arrive late (15 or more minutes).  Arriving late affects you and other patients whose appointments are after yours.  Also, if you miss three or more appointments without notifying the office, you may be dismissed from the clinic at the provider's discretion.      For prescription refill requests, have your pharmacy contact our office and allow 72 hours for refills to be completed.    Today you received the following chemotherapy and/or immunotherapy agents: pembrolizumab [facility])      To help prevent nausea and vomiting after your treatment, we encourage you to take your nausea medication as directed.  BELOW ARE SYMPTOMS THAT SHOULD BE REPORTED IMMEDIATELY: *FEVER GREATER THAN 100.4 F (38 C) OR HIGHER *CHILLS OR SWEATING *NAUSEA AND VOMITING THAT IS NOT CONTROLLED WITH YOUR NAUSEA MEDICATION *UNUSUAL SHORTNESS OF BREATH *UNUSUAL BRUISING OR BLEEDING *URINARY PROBLEMS (pain or burning when urinating, or frequent urination) *BOWEL PROBLEMS (unusual diarrhea, constipation, pain near the anus) TENDERNESS IN MOUTH AND THROAT WITH OR WITHOUT PRESENCE OF ULCERS (sore throat, sores in mouth, or a toothache) UNUSUAL RASH, SWELLING OR PAIN  UNUSUAL VAGINAL DISCHARGE OR ITCHING   Items with * indicate a potential emergency and should be followed up as soon as possible or go to the Emergency Department if any problems should occur.  Please show the CHEMOTHERAPY ALERT CARD or  IMMUNOTHERAPY ALERT CARD at check-in to the Emergency Department and triage nurse.  Should you have questions after your visit or need to cancel or reschedule your appointment, please contact [facility] - A DEPT OF [facility]  Dept: [phone]  and follow the prompts.  Office hours are 8:00 a.m. to 4:30 p.m. Monday - Friday. Please note that voicemails left after 4:00 p.m. may not be returned until the following business day.  We are closed weekends and major holidays. You have access to a nurse at all times for urgent questions. Please call the main number to the clinic Dept: [phone] and follow the prompts.   For any non-urgent questions, you may also contact your provider using MyChart. We now offer e-Visits for anyone 31 and older to request care online for non-urgent symptoms. For details visit [email or web address].   Also download the MyChart app! Go to the app store, search "MyChart", open the app, select [facility], and log in with your MyChart username and password.

## 2024-06-08 NOTE — Assessment & Plan Note (Addendum)
 MCV improved after B12. Continue B12 supplement.

## 2024-06-08 NOTE — Assessment & Plan Note (Addendum)
 Minimal at left calf.  Personal history of eczema. Desonide cream 0.05% twice daily until resolved.

## 2024-06-09 ENCOUNTER — Other Ambulatory Visit: Payer: Self-pay

## 2024-06-28 ENCOUNTER — Encounter: Payer: Self-pay | Admitting: Internal Medicine

## 2024-06-29 NOTE — Assessment & Plan Note (Signed)
 Minimal at left calf.  Personal history of eczema. Desonide cream 0.05% twice daily until resolved.

## 2024-06-29 NOTE — Assessment & Plan Note (Signed)
 Continue adjuvant pembrolizumab  (started on 7/11) Labs, MD visit and treatment every 3 weeks  MRI of brain: NED in 03/2024 Plan on CT with Dr. Alvaro later this year.

## 2024-06-29 NOTE — Progress Notes (Unsigned)
 [facility] OFFICE PROGRESS NOTE  Patient Care Team: Perri Ronal PARAS, MD as PCP - General (Internal Medicine)  Tammy Hendrix is a 53 y.o.female with unremarkable history being follow up at [facility] for left renal cell carcinoma.   Diagnosis: pT3a pN0 cM0. 9.9 x 6.8 x 8.1 cm G4 LEFT renal clear cell RCC. Treatment: left radical nephrectomy with retroperitoneal lymph node dissection.  04/02/24 C1 adjuvant Pembro   Report rash is now slightly improved.  History of eczema comes and goes.  No worsening.  Assessment & Plan Clear cell renal cell carcinoma, left (HCC) Continue adjuvant pembrolizumab  (started on 7/11) Labs, MD visit and treatment every 3 weeks  MRI of brain: NED in 03/2024 Plan on CT with Dr. Alvaro later this year. Rash Minimal at left calf.  Personal history of eczema. Desonide cream 0.05% twice daily until resolved. Stage 3a chronic kidney disease (HCC) Solitary kidney Monitor renal function. Fluid 60-70 oz per day  Orders Placed This Encounter  Procedures   CT CHEST ABDOMEN PELVIS W CONTRAST    Standing Status:   Future    Expected Date:   08/27/2024    Expiration Date:   07/01/2025    If indicated for the ordered procedure, I authorize the administration of contrast media per Radiology protocol:   Yes    Does the patient have a contrast media/X-ray dye allergy?:   No    Preferred imaging location?:   [facility]    If indicated for the ordered procedure, I authorize the administration of oral contrast media per Radiology protocol:   Yes    Is patient pregnant?:   No   T4, free    Standing Status:   Future    Expected Date:   09/03/2024    Expiration Date:   09/03/2025   T4, free    Standing Status:   Future    Expected Date:   10/15/2024    Expiration Date:   10/15/2025   T4, free    Standing Status:   Future    Expected Date:   01/07/2025    Expiration Date:   01/07/2026     Tammy JAYSON Chihuahua, MD  INTERVAL HISTORY: Patient returns  for follow-up. Eczema in the upper back and arm, may have been a bit better. Using desonide. Some fatigue. Drinking a lot of fluid. She has a Psychologist, educational and exercising. Urine is light yellow. No coughing, just allergic to ragweed needing inhaler, stomach pain, diarrhea.  Oncology History  Clear cell renal cell carcinoma, left (HCC)  01/24/2024 Imaging   CT CAP 1. Heterogeneous mass with cystic and solid regions and calcifications in the mid to upper left kidney measuring 9.9 x 6.8 x 8.1 cm, concerning for renal cell carcinoma. There is moderate hydronephrosis involving the lower pole of the left kidney without evidence of obstructing lesion. There is no definite evidence renal vein thrombosis or lymphadenopathy. 2. Trace right pleural effusion with atelectasis at the lung bases. No pulmonary nodule or mass is seen. 3. Aortic atherosclerosis.   01/27/2024 Initial Diagnosis   Clear cell renal cell carcinoma, left (HCC)   02/19/2024 Pathology Results   LEFT KIDNEY WITH PERIAORTIC LYMPH NODES, RESECTION:  Clear cell renal cell carcinoma, nuclear grade 4, size 9.0 cm  Tumor extends into segmental branches of renal vein, perirenal and renal sinus fat, but not beyond Gerotas fascia (pT3a)  Ureteral, vascular and all margins of resection are negative for tumor   Sarcomatoid Features: Negative  Rhabdoid Features: Negative   Regional Lymph Nodes:       No lymph nodes submitted or found: NA       Number of Lymph Nodes Involved: 0       Number of Lymph Nodes Examined: 2     03/09/2024 Cancer Staging   Staging form: Kidney, AJCC 8th Edition - Clinical: Stage III (cT3a, cN0, cM0) - Signed by Tina Tammy BROCKS, MD on 03/09/2024 Histologic grade (G): G4 Histologic grading system: 4 grade system   04/02/2024 -  Chemotherapy   Patient is on Treatment Plan : KIDNEY Pembrolizumab  (200) q21d        PHYSICAL EXAMINATION: ECOG PERFORMANCE STATUS: 0 - Asymptomatic  Vitals:   07/01/24 0851  BP: 133/76   Pulse: 63  Resp: 16  Temp: 97.8 F (36.6 C)  SpO2: 100%   Filed Weights   07/01/24 0851  Weight: 130 lb 12.8 oz (59.3 kg)    GENERAL: alert, no distress and comfortable SKIN: skin color normal and no jaundice  EYES: normal, sclera clear OROPHARYNX: no exudate  NECK: No palpable mass LYMPH:  no palpable cervical, axillary lymphadenopathy  LUNGS: clear to auscultation and no wheeze or rales with normal breathing effort HEART: regular rate & rhythm  ABDOMEN: abdomen soft, non-tender and nondistended. Musculoskeletal: no edema NEURO: no focal motor/sensory deficits  Relevant data reviewed during this visit included labs.  New labs ordered.

## 2024-07-01 ENCOUNTER — Inpatient Hospital Stay

## 2024-07-01 VITALS — BP 133/76 | HR 63 | Temp 97.8°F | Resp 16 | Ht 62.0 in | Wt 130.8 lb

## 2024-07-01 DIAGNOSIS — C642 Malignant neoplasm of left kidney, except renal pelvis: Secondary | ICD-10-CM

## 2024-07-01 DIAGNOSIS — Z5112 Encounter for antineoplastic immunotherapy: Secondary | ICD-10-CM | POA: Diagnosis not present

## 2024-07-01 DIAGNOSIS — R21 Rash and other nonspecific skin eruption: Secondary | ICD-10-CM | POA: Diagnosis not present

## 2024-07-01 DIAGNOSIS — Z79899 Other long term (current) drug therapy: Secondary | ICD-10-CM | POA: Diagnosis not present

## 2024-07-01 DIAGNOSIS — N1831 Chronic kidney disease, stage 3a: Secondary | ICD-10-CM | POA: Insufficient documentation

## 2024-07-01 LAB — CBC WITH DIFFERENTIAL (CANCER CENTER ONLY)
Abs Immature Granulocytes: 0.02 K/uL (ref 0.00–0.07)
Basophils Absolute: 0.1 K/uL (ref 0.0–0.1)
Basophils Relative: 1 %
Eosinophils Absolute: 0.5 K/uL (ref 0.0–0.5)
Eosinophils Relative: 5 %
HCT: 38 % (ref 36.0–46.0)
Hemoglobin: 13.4 g/dL (ref 12.0–15.0)
Immature Granulocytes: 0 %
Lymphocytes Relative: 29 %
Lymphs Abs: 2.8 K/uL (ref 0.7–4.0)
MCH: 33.9 pg (ref 26.0–34.0)
MCHC: 35.3 g/dL (ref 30.0–36.0)
MCV: 96.2 fL (ref 80.0–100.0)
Monocytes Absolute: 0.6 K/uL (ref 0.1–1.0)
Monocytes Relative: 6 %
Neutro Abs: 5.6 K/uL (ref 1.7–7.7)
Neutrophils Relative %: 59 %
Platelet Count: 384 K/uL (ref 150–400)
RBC: 3.95 MIL/uL (ref 3.87–5.11)
RDW: 12.2 % (ref 11.5–15.5)
WBC Count: 9.6 K/uL (ref 4.0–10.5)
nRBC: 0 % (ref 0.0–0.2)

## 2024-07-01 LAB — CMP (CANCER CENTER ONLY)
ALT: 15 U/L (ref 0–44)
AST: 19 U/L (ref 15–41)
Albumin: 3.7 g/dL (ref 3.5–5.0)
Alkaline Phosphatase: 47 U/L (ref 38–126)
Anion gap: 7 (ref 5–15)
BUN: 19 mg/dL (ref 6–20)
CO2: 26 mmol/L (ref 22–32)
Calcium: 9.4 mg/dL (ref 8.9–10.3)
Chloride: 101 mmol/L (ref 98–111)
Creatinine: 1.26 mg/dL — ABNORMAL HIGH (ref 0.44–1.00)
GFR, Estimated: 51 mL/min — ABNORMAL LOW (ref >60)
Glucose, Bld: 85 mg/dL (ref 70–99)
Potassium: 4.2 mmol/L (ref 3.5–5.1)
Sodium: 134 mmol/L — ABNORMAL LOW (ref 135–145)
Total Bilirubin: 1.1 mg/dL (ref 0.0–1.2)
Total Protein: 7.2 g/dL (ref 6.5–8.1)

## 2024-07-01 LAB — T4, FREE: Free T4: 0.49 ng/dL — ABNORMAL LOW (ref 0.61–1.12)

## 2024-07-01 MED ORDER — SODIUM CHLORIDE 0.9 % IV SOLN: INTRAVENOUS | Status: DC

## 2024-07-01 MED ORDER — SODIUM CHLORIDE 0.9 % IV SOLN
200.0000 mg | Freq: Once | INTRAVENOUS | Status: AC
  Administered 2024-07-01: 200 mg via INTRAVENOUS
  Filled 2024-07-01: qty 200

## 2024-07-01 NOTE — Patient Instructions (Signed)
 [facility] - A DEPT OF [facility]  Discharge Instructions: Thank you for choosing [facility] to provide your oncology and hematology care.   If you have a lab appointment with the Cancer Center, please go directly to the Cancer Center and check in at the registration area.   Wear comfortable clothing and clothing appropriate for easy access to any Portacath or PICC line.   We strive to give you quality time with your provider. You may need to reschedule your appointment if you arrive late (15 or more minutes).  Arriving late affects you and other patients whose appointments are after yours.  Also, if you miss three or more appointments without notifying the office, you may be dismissed from the clinic at the provider's discretion.      For prescription refill requests, have your pharmacy contact our office and allow 72 hours for refills to be completed.    Today you received the following chemotherapy and/or immunotherapy agents: pembrolizumab [facility])      To help prevent nausea and vomiting after your treatment, we encourage you to take your nausea medication as directed.  BELOW ARE SYMPTOMS THAT SHOULD BE REPORTED IMMEDIATELY: *FEVER GREATER THAN 100.4 F (38 C) OR HIGHER *CHILLS OR SWEATING *NAUSEA AND VOMITING THAT IS NOT CONTROLLED WITH YOUR NAUSEA MEDICATION *UNUSUAL SHORTNESS OF BREATH *UNUSUAL BRUISING OR BLEEDING *URINARY PROBLEMS (pain or burning when urinating, or frequent urination) *BOWEL PROBLEMS (unusual diarrhea, constipation, pain near the anus) TENDERNESS IN MOUTH AND THROAT WITH OR WITHOUT PRESENCE OF ULCERS (sore throat, sores in mouth, or a toothache) UNUSUAL RASH, SWELLING OR PAIN  UNUSUAL VAGINAL DISCHARGE OR ITCHING   Items with * indicate a potential emergency and should be followed up as soon as possible or go to the Emergency Department if any problems should occur.  Please show the CHEMOTHERAPY ALERT CARD or  IMMUNOTHERAPY ALERT CARD at check-in to the Emergency Department and triage nurse.  Should you have questions after your visit or need to cancel or reschedule your appointment, please contact [facility] - A DEPT OF [facility]  Dept: [phone]  and follow the prompts.  Office hours are 8:00 a.m. to 4:30 p.m. Monday - Friday. Please note that voicemails left after 4:00 p.m. may not be returned until the following business day.  We are closed weekends and major holidays. You have access to a nurse at all times for urgent questions. Please call the main number to the clinic Dept: [phone] and follow the prompts.   For any non-urgent questions, you may also contact your provider using MyChart. We now offer e-Visits for anyone 31 and older to request care online for non-urgent symptoms. For details visit [email or web address].   Also download the MyChart app! Go to the app store, search "MyChart", open the app, select [facility], and log in with your MyChart username and password.

## 2024-07-01 NOTE — Assessment & Plan Note (Addendum)
 Solitary kidney Monitor renal function. Fluid 60-70 oz per day

## 2024-07-02 ENCOUNTER — Other Ambulatory Visit: Payer: Self-pay

## 2024-07-02 ENCOUNTER — Ambulatory Visit: Payer: Self-pay

## 2024-07-02 DIAGNOSIS — E039 Hypothyroidism, unspecified: Secondary | ICD-10-CM

## 2024-07-02 MED ORDER — LEVOTHYROXINE SODIUM 25 MCG PO TABS: 25.0000 ug | ORAL_TABLET | Freq: Every day | ORAL | 0 refills | Status: DC

## 2024-07-16 DIAGNOSIS — F4322 Adjustment disorder with anxiety: Secondary | ICD-10-CM | POA: Diagnosis not present

## 2024-07-23 ENCOUNTER — Inpatient Hospital Stay

## 2024-07-23 ENCOUNTER — Inpatient Hospital Stay: Admitting: Physician Assistant

## 2024-07-23 VITALS — BP 124/79 | HR 68 | Temp 97.3°F | Resp 13 | Wt 135.4 lb

## 2024-07-23 DIAGNOSIS — Z5112 Encounter for antineoplastic immunotherapy: Secondary | ICD-10-CM | POA: Diagnosis not present

## 2024-07-23 DIAGNOSIS — C642 Malignant neoplasm of left kidney, except renal pelvis: Secondary | ICD-10-CM

## 2024-07-23 DIAGNOSIS — Z79899 Other long term (current) drug therapy: Secondary | ICD-10-CM | POA: Diagnosis not present

## 2024-07-23 DIAGNOSIS — N1831 Chronic kidney disease, stage 3a: Secondary | ICD-10-CM | POA: Diagnosis not present

## 2024-07-23 LAB — CMP (CANCER CENTER ONLY)
ALT: 13 U/L (ref 0–44)
AST: 19 U/L (ref 15–41)
Albumin: 3.6 g/dL (ref 3.5–5.0)
Alkaline Phosphatase: 54 U/L (ref 38–126)
Anion gap: 7 (ref 5–15)
BUN: 19 mg/dL (ref 6–20)
CO2: 25 mmol/L (ref 22–32)
Calcium: 9.2 mg/dL (ref 8.9–10.3)
Chloride: 101 mmol/L (ref 98–111)
Creatinine: 1.25 mg/dL — ABNORMAL HIGH (ref 0.44–1.00)
GFR, Estimated: 52 mL/min — ABNORMAL LOW (ref >60)
Glucose, Bld: 53 mg/dL — ABNORMAL LOW (ref 70–99)
Potassium: 4.2 mmol/L (ref 3.5–5.1)
Sodium: 133 mmol/L — ABNORMAL LOW (ref 135–145)
Total Bilirubin: 0.8 mg/dL (ref 0.0–1.2)
Total Protein: 7.2 g/dL (ref 6.5–8.1)

## 2024-07-23 LAB — CBC WITH DIFFERENTIAL (CANCER CENTER ONLY)
Abs Immature Granulocytes: 0.02 K/uL (ref 0.00–0.07)
Basophils Absolute: 0.1 K/uL (ref 0.0–0.1)
Basophils Relative: 1 %
Eosinophils Absolute: 0.9 K/uL — ABNORMAL HIGH (ref 0.0–0.5)
Eosinophils Relative: 9 %
HCT: 36.2 % (ref 36.0–46.0)
Hemoglobin: 12.6 g/dL (ref 12.0–15.0)
Immature Granulocytes: 0 %
Lymphocytes Relative: 39 %
Lymphs Abs: 3.9 K/uL (ref 0.7–4.0)
MCH: 33.3 pg (ref 26.0–34.0)
MCHC: 34.8 g/dL (ref 30.0–36.0)
MCV: 95.8 fL (ref 80.0–100.0)
Monocytes Absolute: 0.7 K/uL (ref 0.1–1.0)
Monocytes Relative: 7 %
Neutro Abs: 4.4 K/uL (ref 1.7–7.7)
Neutrophils Relative %: 44 %
Platelet Count: 337 K/uL (ref 150–400)
RBC: 3.78 MIL/uL — ABNORMAL LOW (ref 3.87–5.11)
RDW: 12.1 % (ref 11.5–15.5)
WBC Count: 10 K/uL (ref 4.0–10.5)
nRBC: 0 % (ref 0.0–0.2)

## 2024-07-23 LAB — TSH: TSH: 2.03 u[IU]/mL (ref 0.350–4.500)

## 2024-07-23 LAB — T4, FREE: Free T4: 0.61 ng/dL (ref 0.61–1.12)

## 2024-07-23 MED ORDER — SODIUM CHLORIDE 0.9 % IV SOLN: INTRAVENOUS | Status: DC

## 2024-07-23 MED ORDER — SODIUM CHLORIDE 0.9 % IV SOLN
200.0000 mg | Freq: Once | INTRAVENOUS | Status: AC
  Administered 2024-07-23: 200 mg via INTRAVENOUS
  Filled 2024-07-23: qty 200

## 2024-07-23 NOTE — Patient Instructions (Signed)
 [facility] - A DEPT OF [facility]  Discharge Instructions: Thank you for choosing [facility] to provide your oncology and hematology care.   If you have a lab appointment with the Cancer Center, please go directly to the Cancer Center and check in at the registration area.   Wear comfortable clothing and clothing appropriate for easy access to any Portacath or PICC line.   We strive to give you quality time with your provider. You may need to reschedule your appointment if you arrive late (15 or more minutes).  Arriving late affects you and other patients whose appointments are after yours.  Also, if you miss three or more appointments without notifying the office, you may be dismissed from the clinic at the provider's discretion.      For prescription refill requests, have your pharmacy contact our office and allow 72 hours for refills to be completed.    Today you received the following chemotherapy and/or immunotherapy agents: pembrolizumab [facility])      To help prevent nausea and vomiting after your treatment, we encourage you to take your nausea medication as directed.  BELOW ARE SYMPTOMS THAT SHOULD BE REPORTED IMMEDIATELY: *FEVER GREATER THAN 100.4 F (38 C) OR HIGHER *CHILLS OR SWEATING *NAUSEA AND VOMITING THAT IS NOT CONTROLLED WITH YOUR NAUSEA MEDICATION *UNUSUAL SHORTNESS OF BREATH *UNUSUAL BRUISING OR BLEEDING *URINARY PROBLEMS (pain or burning when urinating, or frequent urination) *BOWEL PROBLEMS (unusual diarrhea, constipation, pain near the anus) TENDERNESS IN MOUTH AND THROAT WITH OR WITHOUT PRESENCE OF ULCERS (sore throat, sores in mouth, or a toothache) UNUSUAL RASH, SWELLING OR PAIN  UNUSUAL VAGINAL DISCHARGE OR ITCHING   Items with * indicate a potential emergency and should be followed up as soon as possible or go to the Emergency Department if any problems should occur.  Please show the CHEMOTHERAPY ALERT CARD or  IMMUNOTHERAPY ALERT CARD at check-in to the Emergency Department and triage nurse.  Should you have questions after your visit or need to cancel or reschedule your appointment, please contact [facility] - A DEPT OF [facility]  Dept: [phone]  and follow the prompts.  Office hours are 8:00 a.m. to 4:30 p.m. Monday - Friday. Please note that voicemails left after 4:00 p.m. may not be returned until the following business day.  We are closed weekends and major holidays. You have access to a nurse at all times for urgent questions. Please call the main number to the clinic Dept: [phone] and follow the prompts.   For any non-urgent questions, you may also contact your provider using MyChart. We now offer e-Visits for anyone 31 and older to request care online for non-urgent symptoms. For details visit [email or web address].   Also download the MyChart app! Go to the app store, search "MyChart", open the app, select [facility], and log in with your MyChart username and password.

## 2024-07-23 NOTE — Progress Notes (Signed)
 [facility] [phone]   [phone]  PROGRESS NOTE  Patient Care Team: Perri Ronal PARAS, MD as PCP - General (Internal Medicine)  CHIEF COMPLAINTS/PURPOSE OF CONSULTATION:  Clear cell renal cell carcinoma  Oncology History  Clear cell renal cell carcinoma, left (HCC)  01/24/2024 Imaging   CT CAP 1. Heterogeneous mass with cystic and solid regions and calcifications in the mid to upper left kidney measuring 9.9 x 6.8 x 8.1 cm, concerning for renal cell carcinoma. There is moderate hydronephrosis involving the lower pole of the left kidney without evidence of obstructing lesion. There is no definite evidence renal vein thrombosis or lymphadenopathy. 2. Trace right pleural effusion with atelectasis at the lung bases. No pulmonary nodule or mass is seen. 3. Aortic atherosclerosis.   01/27/2024 Initial Diagnosis   Clear cell renal cell carcinoma, left (HCC)   02/19/2024 Pathology Results   LEFT KIDNEY WITH PERIAORTIC LYMPH NODES, RESECTION:  Clear cell renal cell carcinoma, nuclear grade 4, size 9.0 cm  Tumor extends into segmental branches of renal vein, perirenal and renal sinus fat, but not beyond Gerotas fascia (pT3a)  Ureteral, vascular and all margins of resection are negative for tumor   Sarcomatoid Features: Negative  Rhabdoid Features: Negative   Regional Lymph Nodes:       No lymph nodes submitted or found: NA       Number of Lymph Nodes Involved: 0       Number of Lymph Nodes Examined: 2     03/09/2024 Cancer Staging   Staging form: Kidney, AJCC 8th Edition - Clinical: Stage III (cT3a, cN0, cM0) - Signed by Tina Pauletta BROCKS, MD on 03/09/2024 Histologic grade (G): G4 Histologic grading system: 4 grade system   04/02/2024 -  Chemotherapy   Patient is on Treatment Plan : KIDNEY Pembrolizumab  (200) q21d        INTERVAL HISTORY: Tammy Hendrix returns for a toxicity check prior to Cycle 6, Day 1 of pembrolizumab . She is accompanied  by her husband for this visit.   On exam today, Ms. Tangney reports having ongoing fatigue but is uncertain if this is related to immunotherapy versus hypothyroidism. She has noticed weight gain since the last visit. She denies nausea, vomiting or abdominal pain. She reports her bowel movements are not as regular but denies any constipation. She has noticed intermittent episodes of vaginal spotting that has improved over the last few days. She denies fevers, chills, sweats, shortness of breath, chest pain or cough. She has no other complaints. Rest of the ROS is below.   MEDICAL HISTORY:  Past Medical History:  Diagnosis Date   Allergy    SEASONAL   Anemia    AFTER 3RD BABY   Asthma    Depression    Dermatitis    Heart murmur    TOLD AS A CHILD HAD MURMUR BUT NO ONE SAID SINCE    SURGICAL HISTORY: Past Surgical History:  Procedure Laterality Date   COLONOSCOPY     ROBOT ASSISTED LAPAROSCOPIC NEPHRECTOMY Left 02/19/2024   Procedure: NEPHRECTOMY, RADICAL, ROBOT-ASSISTED, LAPAROSCOPIC, ADULT;  Surgeon: Alvaro Ricardo KATHEE Mickey., MD;  Location: [facility];  Service: Urology;  Laterality: Left;   VAGINAL DELIVERY  2001, 2004, 2007   WISDOM TOOTH EXTRACTION     AGE 26 OR 20    SOCIAL HISTORY: Social History   Socioeconomic History   Marital status: Married    Spouse name: Not on file   Number of children: Not on file  Years of education: Not on file   Highest education level: Not on file  Occupational History   Not on file  Tobacco Use   Smoking status: Never   Smokeless tobacco: Never  Vaping Use   Vaping status: Never Used  Substance and Sexual Activity   Alcohol use: Yes    Comment: occas   Drug use: No   Sexual activity: Yes  Other Topics Concern   Not on file  Social History Narrative   Married. 3 children, 2 daughters and 1 son. Non-smoker, social alcohol consumption. Homemaker. 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   Social  Drivers of Corporate Investment Banker Strain: Not on file  Food Insecurity: No Food Insecurity (02/19/2024)   Hunger Vital Sign    Worried About Running Out of Food in the Last Year: Never true    Ran Out of Food in the Last Year: Never true  Transportation Needs: No Transportation Needs (02/19/2024)   PRAPARE - Administrator, Civil Service (Medical): No    Lack of Transportation (Non-Medical): No  Physical Activity: Not on file  Stress: Not on file  Social Connections: Not on file  Intimate Partner Violence: Not At Risk (02/19/2024)   Humiliation, Afraid, Rape, and Kick questionnaire    Fear of Current or Ex-Partner: No    Emotionally Abused: No    Physically Abused: No    Sexually Abused: No    FAMILY HISTORY: Family History  Problem Relation Age of Onset   Hyperlipidemia Mother    Thyroid  disease Mother    Thyroid  cancer Mother 77   Hyperlipidemia Father    Bipolar disorder Father    Neuropathy Father    Colon cancer Neg Hx    Colon polyps Neg Hx    Esophageal cancer Neg Hx    Stomach cancer Neg Hx    Rectal cancer Neg Hx     ALLERGIES:  is allergic to peanuts [peanut oil], shellfish allergy, and bacitracin-polymyxin b.  MEDICATIONS:  Current Outpatient Medications  Medication Sig Dispense Refill   albuterol  (VENTOLIN  HFA) 108 (90 Base) MCG/ACT inhaler Inhale 2 puffs into the lungs every 6 (six) hours as needed for shortness of breath.     ALPRAZolam  (XANAX ) 0.5 MG tablet Take 1 tablet (0.5 mg total) by mouth at bedtime as needed for anxiety. 60 tablet 0   budesonide-formoterol (SYMBICORT) 80-4.5 MCG/ACT inhaler SMARTSIG:By Mouth     clindamycin (CLEOCIN T) 1 % lotion Apply 1 as directed to affected area once a day     desonide (DESOWEN) 0.05 % cream SMARTSIG:sparingly Topical Twice Daily     levothyroxine (SYNTHROID) 25 MCG tablet TAKE 1 TABLET(25 MCG) BY MOUTH DAILY BEFORE BREAKFAST 90 tablet 0   Norethin-Eth Estradiol-Fe [facility] FE,WYMZYA FE,ZENCHENT  FE,ZEOSA) 0.4-35 MG-MCG tablet Chew 1 tablet by mouth daily.     Olopatadine HCl (PATADAY) 0.2 % SOLN 1 drop into affected eye     Prasterone (INTRAROSA) 6.5 MG INST Place vaginally. 2-3 X A WEEK     prochlorperazine  (COMPAZINE ) 10 MG tablet Take 1 tablet (10 mg total) by mouth every 8 (eight) hours as needed for nausea or vomiting. 30 tablet 0   No current facility-administered medications for this visit.    REVIEW OF SYSTEMS:   Constitutional: ( - ) fevers, ( - )  chills , ( - ) night sweats Eyes: ( - ) blurriness of vision, ( - ) double vision, ( - )  watery eyes Ears, nose, mouth, throat, and face: ( - ) mucositis, ( - ) sore throat Respiratory: ( - ) cough, ( - ) dyspnea, ( - ) wheezes Cardiovascular: ( - ) palpitation, ( - ) chest discomfort, ( - ) lower extremity swelling Gastrointestinal:  ( - ) nausea, ( - ) heartburn, ( - ) change in bowel habits Skin: ( - ) abnormal skin rashes Lymphatics: ( - ) new lymphadenopathy, ( - ) easy bruising Neurological: ( - ) numbness, ( - ) tingling, ( - ) new weaknesses Behavioral/Psych: ( - ) mood change, ( - ) new changes  All other systems were reviewed with the patient and are negative.  PHYSICAL EXAMINATION: ECOG PERFORMANCE STATUS: 1 - Symptomatic but completely ambulatory  Vitals:   07/23/24 1001  BP: 124/79  Pulse: 68  Resp: 13  Temp: (!) 97.3 F (36.3 C)  SpO2: 100%   Filed Weights   07/23/24 1001  Weight: 135 lb 6.4 oz (61.4 kg)    GENERAL: well appearing Hendrix in NAD  SKIN: skin color, texture, turgor are normal, no rashes or significant lesions EYES: conjunctiva are pink and non-injected, sclera clear LUNGS: clear to auscultation and percussion with normal breathing effort HEART: regular rate & rhythm and no murmurs and no lower extremity edema Musculoskeletal: no cyanosis of digits and no clubbing  PSYCH: alert & oriented x 3, fluent speech NEURO: no focal motor/sensory deficits  LABORATORY DATA:  I have reviewed  the data as listed    Latest Ref Rng & Units 07/23/2024    9:27 AM 07/01/2024    8:33 AM 06/08/2024    8:36 AM  CBC  WBC 4.0 - 10.5 K/uL 10.0  9.6  8.7   Hemoglobin 12.0 - 15.0 g/dL 87.3  86.5  87.0   Hematocrit 36.0 - 46.0 % 36.2  38.0  37.1   Platelets 150 - 400 K/uL 337  384  348        Latest Ref Rng & Units 07/23/2024    9:27 AM 07/01/2024    8:33 AM 06/08/2024    8:36 AM  CMP  Glucose 70 - 99 mg/dL 53  85  76   BUN 6 - 20 mg/dL 19  19  19    Creatinine 0.44 - 1.00 mg/dL 8.74  8.73  8.78   Sodium 135 - 145 mmol/L 133  134  137   Potassium 3.5 - 5.1 mmol/L 4.2  4.2  4.3   Chloride 98 - 111 mmol/L 101  101  107   CO2 22 - 32 mmol/L 25  26  23    Calcium 8.9 - 10.3 mg/dL 9.2  9.4  8.4   Total Protein 6.5 - 8.1 g/dL 7.2  7.2  6.4   Total Bilirubin 0.0 - 1.2 mg/dL 0.8  1.1  0.6   Alkaline Phos 38 - 126 U/L 54  47  48   AST 15 - 41 U/L 19  19  15    ALT 0 - 44 U/L 13  15  10      RADIOGRAPHIC STUDIES: I have personally reviewed the radiological images as listed and agreed with the findings in the report. No results found.  ASSESSMENT & PLAN ROSAISELA JAMROZ is a 53 y.o. Hendrix who presents to the clinic for continued management of left renal cell carcinoma.   #Clear cell renal cell carcinoma, left: --Underwent left radical nephrectomy with retroperitoneal lymph node dissection. Pathology revealed pT3a pN0 cM0. 9.9 x 6.8 x  8.1 cm G4, renal clear cell RCC  --Started adjuvant pembrolizumab  on 04/02/2024 PLAN: --Due for cycle 6, day 1 of pembrolizumab  today --WBC 10.0, Hgb 12.6, Plt 337, creatinine stable at 1.25, LFTs normal --Proceed with treatment without any dose modifications --RTC in 3 weeks with labs and follow up before Cycle 7, day 1.   #Hypothyroidism: --Currently on synthroid 25 mcg PO daily  --Thyroid  labs pending  #Rash: --History of eczema --Applies desonide cream 0.05% twice daily as needed.  #CKD, stage 3a: --Creatinine stable at 1.25.  --Continue to  hydrate with 60-70 oz of fluids per day   No orders of the defined types were placed in this encounter.   All questions were answered. The patient knows to call the clinic with any problems, questions or concerns.  I have spent a total of 30 minutes minutes of face-to-face and non-face-to-face time, preparing to see the patient, performing a medically appropriate examination, counseling and educating the patient,documenting clinical information in the electronic health record, independently interpreting results and communicating results to the patient, and care coordination.   Johnston Police, PA-C Department of Hematology/Oncology [facility] at [facility] Phone: [phone]

## 2024-08-12 NOTE — Assessment & Plan Note (Addendum)
 Continue adjuvant pembrolizumab  (started on 7/11) Labs, MD visit and treatment every 3 weeks  MRI of brain: NED in 03/2024 Plan on CT on 12/5

## 2024-08-12 NOTE — Assessment & Plan Note (Addendum)
 Solitary kidney Monitor renal function. Fluid 60-70 oz per day

## 2024-08-13 ENCOUNTER — Other Ambulatory Visit: Payer: Self-pay

## 2024-08-13 ENCOUNTER — Inpatient Hospital Stay

## 2024-08-13 VITALS — BP 111/69 | HR 82 | Temp 97.8°F | Resp 18 | Wt 135.6 lb

## 2024-08-13 DIAGNOSIS — C642 Malignant neoplasm of left kidney, except renal pelvis: Secondary | ICD-10-CM

## 2024-08-13 DIAGNOSIS — M1380 Other specified arthritis, unspecified site: Secondary | ICD-10-CM

## 2024-08-13 DIAGNOSIS — R21 Rash and other nonspecific skin eruption: Secondary | ICD-10-CM

## 2024-08-13 DIAGNOSIS — Z79899 Other long term (current) drug therapy: Secondary | ICD-10-CM | POA: Diagnosis not present

## 2024-08-13 DIAGNOSIS — N1831 Chronic kidney disease, stage 3a: Secondary | ICD-10-CM

## 2024-08-13 LAB — CBC WITH DIFFERENTIAL (CANCER CENTER ONLY)
Abs Immature Granulocytes: 0.02 K/uL (ref 0.00–0.07)
Basophils Absolute: 0 K/uL (ref 0.0–0.1)
Basophils Relative: 1 %
Eosinophils Absolute: 0.8 K/uL — ABNORMAL HIGH (ref 0.0–0.5)
Eosinophils Relative: 9 %
HCT: 35.5 % — ABNORMAL LOW (ref 36.0–46.0)
Hemoglobin: 12.3 g/dL (ref 12.0–15.0)
Immature Granulocytes: 0 %
Lymphocytes Relative: 34 %
Lymphs Abs: 2.8 K/uL (ref 0.7–4.0)
MCH: 33.2 pg (ref 26.0–34.0)
MCHC: 34.6 g/dL (ref 30.0–36.0)
MCV: 95.7 fL (ref 80.0–100.0)
Monocytes Absolute: 0.7 K/uL (ref 0.1–1.0)
Monocytes Relative: 9 %
Neutro Abs: 4 K/uL (ref 1.7–7.7)
Neutrophils Relative %: 47 %
Platelet Count: 384 K/uL (ref 150–400)
RBC: 3.71 MIL/uL — ABNORMAL LOW (ref 3.87–5.11)
RDW: 12 % (ref 11.5–15.5)
WBC Count: 8.3 K/uL (ref 4.0–10.5)
nRBC: 0 % (ref 0.0–0.2)

## 2024-08-13 LAB — CMP (CANCER CENTER ONLY)
ALT: 16 U/L (ref 0–44)
AST: 27 U/L (ref 15–41)
Albumin: 3.4 g/dL — ABNORMAL LOW (ref 3.5–5.0)
Alkaline Phosphatase: 64 U/L (ref 38–126)
Anion gap: 11 (ref 5–15)
BUN: 15 mg/dL (ref 6–20)
CO2: 21 mmol/L — ABNORMAL LOW (ref 22–32)
Calcium: 9.5 mg/dL (ref 8.9–10.3)
Chloride: 103 mmol/L (ref 98–111)
Creatinine: 1.11 mg/dL — ABNORMAL HIGH (ref 0.44–1.00)
GFR, Estimated: 59 mL/min — ABNORMAL LOW (ref >60)
Glucose, Bld: 88 mg/dL (ref 70–99)
Potassium: 4 mmol/L (ref 3.5–5.1)
Sodium: 135 mmol/L (ref 135–145)
Total Bilirubin: 0.6 mg/dL (ref 0.0–1.2)
Total Protein: 6.5 g/dL (ref 6.5–8.1)

## 2024-08-13 LAB — T4, FREE: Free T4: 0.78 ng/dL (ref 0.61–1.12)

## 2024-08-13 LAB — C-REACTIVE PROTEIN: CRP: 1.3 mg/dL — ABNORMAL HIGH (ref <1.0)

## 2024-08-13 MED ORDER — METHYLPREDNISOLONE 4 MG PO TBPK: ORAL_TABLET | ORAL | 0 refills | Status: AC

## 2024-08-13 NOTE — Assessment & Plan Note (Signed)
 Recommend vitamin D  and calcium daily.

## 2024-08-13 NOTE — Progress Notes (Signed)
 [facility] OFFICE PROGRESS NOTE  Patient Care Team: Perri Ronal PARAS, MD as PCP - General (Internal Medicine)  Linell is a 53 y.o.female with unremarkable history being [facility] renal cell carcinoma.   Diagnosis: pT3a pN0 cM0. 9.9 x 6.8 x 8.1 cm G4 LEFT renal clear cell RCC. Treatment: left radical nephrectomy with retroperitoneal lymph node dissection.  04/02/24 C1 adjuvant Pembro  New immune related side effect. Some face rash and is using hydrocortisone cream.  Upper back rash is getting worse.  Some joint stiffness and muscle ache.  Inflammatory arthritis and rash.  Will postpone treatment for 3 weeks.  Medrol  Dosepak sent to pharmacy today. Assessment & Plan Clear cell renal cell carcinoma, left (HCC) Continue adjuvant pembrolizumab  (started on 7/11) Labs, MD visit and treatment every 3 weeks  MRI of brain: NED in 03/2024 Plan on CT on 12/5 Stage 3a chronic kidney disease (HCC) Solitary kidney Monitor renal function. Fluid 60-70 oz per day Rash Worsening, trial of Medrol  Dosepak Holding treatment Personal history of eczema. Desonide cream 0.05% twice daily until resolved. Inflammatory arthritis Medrol  Dosepak Follow-up in 3 weeks    [name], MD  INTERVAL HISTORY: Patient returns for follow-up. Rash worse on the face. The rest is fine. Lots of fatigue.  For the past 2 weeks, some muscle soreness, aches, stiff in joints. Movement helps. She had flu vaccine 10/22nd before last treatment, and covid vaccine on 11/12. The symptoms started about 11/6-7.  No coughing, short of breath or diarrhea or fever. Asthma is worse. Occasional headache, tylenol  helps.not worse.  Oncology History  Clear cell renal cell carcinoma, left (HCC)  01/24/2024 Imaging   CT CAP 1. Heterogeneous mass with cystic and solid regions and calcifications in the mid to upper left kidney measuring 9.9 x 6.8 x 8.1 cm, concerning for renal cell  carcinoma. There is moderate hydronephrosis involving the lower pole of the left kidney without evidence of obstructing lesion. There is no definite evidence renal vein thrombosis or lymphadenopathy. 2. Trace right pleural effusion with atelectasis at the lung bases. No pulmonary nodule or mass is seen. 3. Aortic atherosclerosis.   01/27/2024 Initial Diagnosis   Clear cell renal cell carcinoma, left (HCC)   02/19/2024 Pathology Results   LEFT KIDNEY WITH PERIAORTIC LYMPH NODES, RESECTION:  Clear cell renal cell carcinoma, nuclear grade 4, size 9.0 cm  Tumor extends into segmental branches of renal vein, perirenal and renal sinus fat, but not beyond Gerotas fascia (pT3a)  Ureteral, vascular and all margins of resection are negative for tumor   Sarcomatoid Features: Negative  Rhabdoid Features: Negative   Regional Lymph Nodes:       No lymph nodes submitted or found: NA       Number of Lymph Nodes Involved: 0       Number of Lymph Nodes Examined: 2     03/09/2024 Cancer Staging   Staging form: Kidney, AJCC 8th Edition - Clinical: Stage III (cT3a, cN0, cM0) - Signed by Chihuahua Pauletta JAYSON, MD on 03/09/2024 Histologic grade (G): G4 Histologic grading system: 4 grade system   04/02/2024 -  Chemotherapy   Patient is on Treatment Plan : KIDNEY Pembrolizumab  (200) q21d        PHYSICAL EXAMINATION: ECOG PERFORMANCE STATUS: 0  Vitals:   08/13/24 0911  BP: 111/69  Pulse: 82  Resp: 18  Temp: 97.8 F (36.6 C)  SpO2: 100%   Filed Weights   08/13/24 0911  Weight: 135 lb 9.6 oz (61.5 kg)    GENERAL: alert, no distress and comfortable SKIN: skin color normal and no jaundice  Eczematous rash over the upper back, few over the arm Bilateral facial reddish rash LUNGS: clear to auscultation and no wheeze or rales with normal breathing effort HEART: regular rate & rhythm  Musculoskeletal: no edema  Relevant data reviewed during this visit included labs.

## 2024-08-13 NOTE — Assessment & Plan Note (Addendum)
 Worsening, trial of Medrol  Dosepak Holding treatment Personal history of eczema. Desonide cream 0.05% twice daily until resolved.

## 2024-08-15 ENCOUNTER — Other Ambulatory Visit: Payer: Self-pay

## 2024-08-23 ENCOUNTER — Encounter: Payer: Self-pay | Admitting: Internal Medicine

## 2024-08-27 ENCOUNTER — Ambulatory Visit (HOSPITAL_BASED_OUTPATIENT_CLINIC_OR_DEPARTMENT_OTHER): Admission: RE | Admit: 2024-08-27 | Discharge: 2024-08-27 | Disposition: A | Source: Ambulatory Visit

## 2024-08-27 DIAGNOSIS — I700 Atherosclerosis of aorta: Secondary | ICD-10-CM | POA: Diagnosis not present

## 2024-08-27 DIAGNOSIS — C642 Malignant neoplasm of left kidney, except renal pelvis: Secondary | ICD-10-CM

## 2024-08-27 DIAGNOSIS — Z9089 Acquired absence of other organs: Secondary | ICD-10-CM | POA: Diagnosis not present

## 2024-08-27 DIAGNOSIS — Z905 Acquired absence of kidney: Secondary | ICD-10-CM | POA: Diagnosis not present

## 2024-08-27 DIAGNOSIS — R918 Other nonspecific abnormal finding of lung field: Secondary | ICD-10-CM | POA: Diagnosis not present

## 2024-08-27 LAB — POCT I-STAT CREATININE: Creatinine, Ser: 1.2 mg/dL — ABNORMAL HIGH (ref 0.44–1.00)

## 2024-08-27 MED ORDER — IOHEXOL 300 MG/ML  SOLN
100.0000 mL | Freq: Once | INTRAMUSCULAR | Status: AC | PRN
  Administered 2024-08-27: 100 mL via INTRAVENOUS

## 2024-09-01 NOTE — Progress Notes (Unsigned)
 [facility] OFFICE PROGRESS NOTE  Patient Care Team: Perri Ronal PARAS, MD as PCP - General (Internal Medicine)  Assessment & Plan Clear cell renal cell carcinoma, left [facility]) Report few subcentimeter nodules on the right. Recommend short term follow up in about 2 months Continue immunotherapy Lung nodules Unclear if these are truly new or not. Two were not in the same plane Recommend short term follow up in about 2 months Acquired hypothyroidism Continue levothyroxine . Increase to 50 mcg daily  Orders Placed This Encounter  Procedures   CT CHEST ABDOMEN PELVIS W CONTRAST    Standing Status:   Future    Expected Date:   11/08/2024    Expiration Date:   09/03/2025    If indicated for the ordered procedure, I authorize the administration of contrast media per Radiology protocol:   Yes    Does the patient have a contrast media/X-ray dye allergy?:   No    Preferred imaging location?:   [facility]    If indicated for the ordered procedure, I authorize the administration of oral contrast media per Radiology protocol:   Yes    Is patient pregnant?:   No   MR Brain W Wo Contrast    Standing Status:   Future    Expiration Date:   09/03/2025    If indicated for the ordered procedure, I authorize the administration of contrast media per Radiology protocol:   Yes    What is the patient's sedation requirement?:   No Sedation    Does the patient have a pacemaker or implanted devices?:   No    Use SRS Protocol?:   Yes    Preferred imaging location?:   [facility] (table limit - 500lbs)     [name], MD  INTERVAL HISTORY: Patient returns for follow-up. Rash comes and goes. Worse on the face today. No coughing, short of breath, diarrhea. No new headaches or neurologic symptoms.  Oncology History  Clear cell renal cell carcinoma, left (HCC)  01/24/2024 Imaging   CT CAP 1. Heterogeneous mass with cystic and solid regions and calcifications in the mid to  upper left kidney measuring 9.9 x 6.8 x 8.1 cm, concerning for renal cell carcinoma. There is moderate hydronephrosis involving the lower pole of the left kidney without evidence of obstructing lesion. There is no definite evidence renal vein thrombosis or lymphadenopathy. 2. Trace right pleural effusion with atelectasis at the lung bases. No pulmonary nodule or mass is seen. 3. Aortic atherosclerosis.   01/27/2024 Initial Diagnosis   Clear cell renal cell carcinoma, left (HCC)   02/19/2024 Pathology Results   LEFT KIDNEY WITH PERIAORTIC LYMPH NODES, RESECTION:  Clear cell renal cell carcinoma, nuclear grade 4, size 9.0 cm  Tumor extends into segmental branches of renal vein, perirenal and renal sinus fat, but not beyond Gerotas fascia (pT3a)  Ureteral, vascular and all margins of resection are negative for tumor   Sarcomatoid Features: Negative  Rhabdoid Features: Negative   Regional Lymph Nodes:       No lymph nodes submitted or found: NA       Number of Lymph Nodes Involved: 0       Number of Lymph Nodes Examined: 2     03/09/2024 Cancer Staging   Staging form: Kidney, AJCC 8th Edition - Clinical: Stage III (cT3a, cN0, cM0) - Signed by Chihuahua Pauletta JAYSON, MD on 03/09/2024 Histologic grade (G): G4 Histologic grading system: 4 grade system   04/02/2024 -  Chemotherapy   Patient is on Treatment Plan : KIDNEY Pembrolizumab  (200) q21d        PHYSICAL EXAMINATION: ECOG PERFORMANCE STATUS: 0  Vitals:   09/03/24 1000  BP: 116/75  Pulse: 72  Resp: 18  Temp: (!) 97.3 F (36.3 C)  SpO2: 100%   Filed Weights   09/03/24 1000  Weight: 136 lb (61.7 kg)    GENERAL: alert, no distress and comfortable SKIN: skin color normal and no jaundice Patchy red facial rashes and neck EYES: normal, sclera clear OROPHARYNX: no exudate  LUNGS: clear to auscultation and no wheeze or rales with normal breathing effort HEART: regular rate & rhythm  ABDOMEN: abdomen soft, non-tender and  nondistended. Musculoskeletal: no edema NEURO: no focal motor/sensory deficits. CN II-XII  Relevant data reviewed during this visit included labs.  New imaging ordered.

## 2024-09-02 ENCOUNTER — Ambulatory Visit: Payer: Self-pay

## 2024-09-03 ENCOUNTER — Inpatient Hospital Stay

## 2024-09-03 ENCOUNTER — Other Ambulatory Visit: Payer: Self-pay | Admitting: *Deleted

## 2024-09-03 ENCOUNTER — Other Ambulatory Visit: Payer: Self-pay

## 2024-09-03 ENCOUNTER — Ambulatory Visit: Payer: Self-pay

## 2024-09-03 VITALS — BP 116/75 | HR 72 | Temp 97.3°F | Resp 18 | Ht 62.0 in | Wt 136.0 lb

## 2024-09-03 VITALS — BP 114/85 | HR 67 | Temp 98.4°F | Resp 16

## 2024-09-03 DIAGNOSIS — R21 Rash and other nonspecific skin eruption: Secondary | ICD-10-CM | POA: Diagnosis not present

## 2024-09-03 DIAGNOSIS — C642 Malignant neoplasm of left kidney, except renal pelvis: Secondary | ICD-10-CM | POA: Diagnosis not present

## 2024-09-03 DIAGNOSIS — Z79899 Other long term (current) drug therapy: Secondary | ICD-10-CM | POA: Diagnosis not present

## 2024-09-03 DIAGNOSIS — E039 Hypothyroidism, unspecified: Secondary | ICD-10-CM | POA: Diagnosis not present

## 2024-09-03 DIAGNOSIS — R918 Other nonspecific abnormal finding of lung field: Secondary | ICD-10-CM | POA: Diagnosis not present

## 2024-09-03 LAB — CBC WITH DIFFERENTIAL (CANCER CENTER ONLY)
Abs Immature Granulocytes: 0.01 K/uL (ref 0.00–0.07)
Basophils Absolute: 0.1 K/uL (ref 0.0–0.1)
Basophils Relative: 1 %
Eosinophils Absolute: 0.8 K/uL — ABNORMAL HIGH (ref 0.0–0.5)
Eosinophils Relative: 10 %
HCT: 33.2 % — ABNORMAL LOW (ref 36.0–46.0)
Hemoglobin: 11.4 g/dL — ABNORMAL LOW (ref 12.0–15.0)
Immature Granulocytes: 0 %
Lymphocytes Relative: 34 %
Lymphs Abs: 2.6 K/uL (ref 0.7–4.0)
MCH: 33.3 pg (ref 26.0–34.0)
MCHC: 34.3 g/dL (ref 30.0–36.0)
MCV: 97.1 fL (ref 80.0–100.0)
Monocytes Absolute: 0.7 K/uL (ref 0.1–1.0)
Monocytes Relative: 9 %
Neutro Abs: 3.6 K/uL (ref 1.7–7.7)
Neutrophils Relative %: 46 %
Platelet Count: 271 K/uL (ref 150–400)
RBC: 3.42 MIL/uL — ABNORMAL LOW (ref 3.87–5.11)
RDW: 12.5 % (ref 11.5–15.5)
WBC Count: 7.8 K/uL (ref 4.0–10.5)
nRBC: 0 % (ref 0.0–0.2)

## 2024-09-03 LAB — CMP (CANCER CENTER ONLY)
ALT: 13 U/L (ref 0–44)
AST: 22 U/L (ref 15–41)
Albumin: 3.3 g/dL — ABNORMAL LOW (ref 3.5–5.0)
Alkaline Phosphatase: 57 U/L (ref 38–126)
Anion gap: 10 (ref 5–15)
BUN: 12 mg/dL (ref 6–20)
CO2: 21 mmol/L — ABNORMAL LOW (ref 22–32)
Calcium: 8.8 mg/dL — ABNORMAL LOW (ref 8.9–10.3)
Chloride: 105 mmol/L (ref 98–111)
Creatinine: 1.22 mg/dL — ABNORMAL HIGH (ref 0.44–1.00)
GFR, Estimated: 53 mL/min — ABNORMAL LOW (ref >60)
Glucose, Bld: 73 mg/dL (ref 70–99)
Potassium: 4.1 mmol/L (ref 3.5–5.1)
Sodium: 136 mmol/L (ref 135–145)
Total Bilirubin: 0.6 mg/dL (ref 0.0–1.2)
Total Protein: 6.3 g/dL — ABNORMAL LOW (ref 6.5–8.1)

## 2024-09-03 LAB — FERRITIN: Ferritin: 82 ng/mL (ref 11–307)

## 2024-09-03 LAB — T4, FREE: Free T4: 0.5 ng/dL — ABNORMAL LOW (ref 0.61–1.12)

## 2024-09-03 LAB — C-REACTIVE PROTEIN: CRP: 1 mg/dL — ABNORMAL HIGH (ref <1.0)

## 2024-09-03 LAB — TSH: TSH: 4.27 u[IU]/mL (ref 0.350–4.500)

## 2024-09-03 LAB — VITAMIN B12: Vitamin B-12: 860 pg/mL (ref 180–914)

## 2024-09-03 MED ORDER — LEVOTHYROXINE SODIUM 50 MCG PO TABS: 50.0000 ug | ORAL_TABLET | Freq: Every day | ORAL | 1 refills | Status: DC

## 2024-09-03 MED ORDER — SODIUM CHLORIDE 0.9 % IV SOLN: INTRAVENOUS | Status: DC

## 2024-09-03 MED ORDER — SODIUM CHLORIDE 0.9 % IV SOLN
200.0000 mg | Freq: Once | INTRAVENOUS | Status: AC
  Administered 2024-09-03: 200 mg via INTRAVENOUS
  Filled 2024-09-03: qty 200

## 2024-09-03 NOTE — Patient Instructions (Signed)
 [facility] - A DEPT OF [facility]  Discharge Instructions: Thank you for choosing [facility] to provide your oncology and hematology care.   If you have a lab appointment with the Cancer Center, please go directly to the Cancer Center and check in at the registration area.   Wear comfortable clothing and clothing appropriate for easy access to any Portacath or PICC line.   We strive to give you quality time with your provider. You may need to reschedule your appointment if you arrive late (15 or more minutes).  Arriving late affects you and other patients whose appointments are after yours.  Also, if you miss three or more appointments without notifying the office, you may be dismissed from the clinic at the provider's discretion.      For prescription refill requests, have your pharmacy contact our office and allow 72 hours for refills to be completed.    Today you received the following chemotherapy and/or immunotherapy agents: pembrolizumab [facility])      To help prevent nausea and vomiting after your treatment, we encourage you to take your nausea medication as directed.  BELOW ARE SYMPTOMS THAT SHOULD BE REPORTED IMMEDIATELY: *FEVER GREATER THAN 100.4 F (38 C) OR HIGHER *CHILLS OR SWEATING *NAUSEA AND VOMITING THAT IS NOT CONTROLLED WITH YOUR NAUSEA MEDICATION *UNUSUAL SHORTNESS OF BREATH *UNUSUAL BRUISING OR BLEEDING *URINARY PROBLEMS (pain or burning when urinating, or frequent urination) *BOWEL PROBLEMS (unusual diarrhea, constipation, pain near the anus) TENDERNESS IN MOUTH AND THROAT WITH OR WITHOUT PRESENCE OF ULCERS (sore throat, sores in mouth, or a toothache) UNUSUAL RASH, SWELLING OR PAIN  UNUSUAL VAGINAL DISCHARGE OR ITCHING   Items with * indicate a potential emergency and should be followed up as soon as possible or go to the Emergency Department if any problems should occur.  Please show the CHEMOTHERAPY ALERT CARD or  IMMUNOTHERAPY ALERT CARD at check-in to the Emergency Department and triage nurse.  Should you have questions after your visit or need to cancel or reschedule your appointment, please contact [facility] - A DEPT OF [facility]  Dept: [phone]  and follow the prompts.  Office hours are 8:00 a.m. to 4:30 p.m. Monday - Friday. Please note that voicemails left after 4:00 p.m. may not be returned until the following business day.  We are closed weekends and major holidays. You have access to a nurse at all times for urgent questions. Please call the main number to the clinic Dept: [phone] and follow the prompts.   For any non-urgent questions, you may also contact your provider using MyChart. We now offer e-Visits for anyone 31 and older to request care online for non-urgent symptoms. For details visit [email or web address].   Also download the MyChart app! Go to the app store, search "MyChart", open the app, select [facility], and log in with your MyChart username and password.

## 2024-09-03 NOTE — Assessment & Plan Note (Addendum)
 Continue levothyroxine . Increase to 50 mcg daily

## 2024-09-03 NOTE — Assessment & Plan Note (Signed)
 Personal history of eczema. Desonide cream 0.05% twice daily until resolved.

## 2024-09-03 NOTE — Assessment & Plan Note (Addendum)
 Report few subcentimeter nodules on the right. Recommend short term follow up in about 2 months Continue immunotherapy

## 2024-09-05 ENCOUNTER — Other Ambulatory Visit: Payer: Self-pay

## 2024-09-24 ENCOUNTER — Other Ambulatory Visit: Payer: Self-pay

## 2024-09-24 ENCOUNTER — Inpatient Hospital Stay

## 2024-09-24 ENCOUNTER — Inpatient Hospital Stay (HOSPITAL_BASED_OUTPATIENT_CLINIC_OR_DEPARTMENT_OTHER)

## 2024-09-24 VITALS — BP 111/57 | HR 82 | Temp 97.8°F | Resp 17 | Ht 62.0 in | Wt 138.0 lb

## 2024-09-24 DIAGNOSIS — R21 Rash and other nonspecific skin eruption: Secondary | ICD-10-CM | POA: Diagnosis not present

## 2024-09-24 DIAGNOSIS — E039 Hypothyroidism, unspecified: Secondary | ICD-10-CM

## 2024-09-24 DIAGNOSIS — C642 Malignant neoplasm of left kidney, except renal pelvis: Secondary | ICD-10-CM

## 2024-09-24 DIAGNOSIS — R918 Other nonspecific abnormal finding of lung field: Secondary | ICD-10-CM

## 2024-09-24 DIAGNOSIS — Z79899 Other long term (current) drug therapy: Secondary | ICD-10-CM | POA: Diagnosis not present

## 2024-09-24 DIAGNOSIS — J90 Pleural effusion, not elsewhere classified: Secondary | ICD-10-CM | POA: Insufficient documentation

## 2024-09-24 DIAGNOSIS — Z5111 Encounter for antineoplastic chemotherapy: Secondary | ICD-10-CM | POA: Diagnosis present

## 2024-09-24 LAB — CMP (CANCER CENTER ONLY)
ALT: 16 U/L (ref 0–44)
AST: 29 U/L (ref 15–41)
Albumin: 3.5 g/dL (ref 3.5–5.0)
Alkaline Phosphatase: 75 U/L (ref 38–126)
Anion gap: 11 (ref 5–15)
BUN: 15 mg/dL (ref 6–20)
CO2: 22 mmol/L (ref 22–32)
Calcium: 9 mg/dL (ref 8.9–10.3)
Chloride: 98 mmol/L (ref 98–111)
Creatinine: 1.02 mg/dL — ABNORMAL HIGH (ref 0.44–1.00)
GFR, Estimated: >60 mL/min
Glucose, Bld: 95 mg/dL (ref 70–99)
Potassium: 4.1 mmol/L (ref 3.5–5.1)
Sodium: 131 mmol/L — ABNORMAL LOW (ref 135–145)
Total Bilirubin: 0.4 mg/dL (ref 0.0–1.2)
Total Protein: 6.8 g/dL (ref 6.5–8.1)

## 2024-09-24 LAB — CBC WITH DIFFERENTIAL (CANCER CENTER ONLY)
Abs Immature Granulocytes: 0.01 K/uL (ref 0.00–0.07)
Basophils Absolute: 0.1 K/uL (ref 0.0–0.1)
Basophils Relative: 1 %
Eosinophils Absolute: 0.8 K/uL — ABNORMAL HIGH (ref 0.0–0.5)
Eosinophils Relative: 9 %
HCT: 32.5 % — ABNORMAL LOW (ref 36.0–46.0)
Hemoglobin: 11.5 g/dL — ABNORMAL LOW (ref 12.0–15.0)
Immature Granulocytes: 0 %
Lymphocytes Relative: 29 %
Lymphs Abs: 2.6 K/uL (ref 0.7–4.0)
MCH: 33.3 pg (ref 26.0–34.0)
MCHC: 35.4 g/dL (ref 30.0–36.0)
MCV: 94.2 fL (ref 80.0–100.0)
Monocytes Absolute: 0.7 K/uL (ref 0.1–1.0)
Monocytes Relative: 8 %
Neutro Abs: 4.8 K/uL (ref 1.7–7.7)
Neutrophils Relative %: 53 %
Platelet Count: 379 K/uL (ref 150–400)
RBC: 3.45 MIL/uL — ABNORMAL LOW (ref 3.87–5.11)
RDW: 12 % (ref 11.5–15.5)
WBC Count: 9 K/uL (ref 4.0–10.5)
nRBC: 0 % (ref 0.0–0.2)

## 2024-09-24 LAB — TSH: TSH: 2.11 u[IU]/mL (ref 0.350–4.500)

## 2024-09-24 LAB — T4, FREE: Free T4: 0.99 ng/dL (ref 0.80–2.00)

## 2024-09-24 MED ORDER — SODIUM CHLORIDE 0.9 % IV SOLN: INTRAVENOUS | Status: DC

## 2024-09-24 MED ORDER — LEVOTHYROXINE SODIUM 75 MCG PO TABS: 75.0000 ug | ORAL_TABLET | Freq: Every day | ORAL | 0 refills | Status: DC

## 2024-09-24 MED ORDER — DESONIDE 0.05 % EX CREA: TOPICAL_CREAM | Freq: Two times a day (BID) | CUTANEOUS | 0 refills | Status: AC

## 2024-09-24 MED ORDER — SODIUM CHLORIDE 0.9 % IV SOLN
200.0000 mg | Freq: Once | INTRAVENOUS | Status: AC
  Administered 2024-09-24: 200 mg via INTRAVENOUS
  Filled 2024-09-24: qty 200

## 2024-09-24 NOTE — Patient Instructions (Signed)

## 2024-09-24 NOTE — Assessment & Plan Note (Addendum)
 Unclear if these are truly new or not. Two were not in the same plane Recommend short term follow up in about 11/08/2024

## 2024-09-24 NOTE — Assessment & Plan Note (Addendum)
 Report few subcentimeter nodules on the right. Recommend short term follow up in about 2 months Continue immunotherapy

## 2024-09-24 NOTE — Progress Notes (Signed)
 [facility] OFFICE PROGRESS NOTE  Patient Care Team: Perri Ronal PARAS, MD as PCP - General (Internal Medicine)  Tammy Hendrix is a 54 y.o.female with unremarkable history being follow [facility] at [facility] for left renal cell carcinoma.   Diagnosis: pT3a pN0 cM0. 9.9 x 6.8 x 8.1 cm G4 LEFT renal clear cell RCC. Treatment: left radical nephrectomy with retroperitoneal lymph node dissection.  04/02/24 C1 adjuvant Pembro  Clinically she is recovering from recent COVID very well.  No concerning symptoms.  Will proceed with immunotherapy today.  Symptoms of hypothyroidism including hair loss, harder stool, weight gain, dry skin.  Will increase dose of levothyroxine . Assessment & Plan Clear cell renal cell carcinoma, left (HCC) Report few subcentimeter nodules on the right. Recommend short term follow up in about 2 months Continue immunotherapy Pulmonary nodules Unclear if these are truly new or not. Two were not in the same plane Recommend short term follow up in about 11/08/2024  Acquired hypothyroidism Continue levothyroxine . Increase to 75 mcg daily on 12/12 Follow-up results. Rash Refill steroid topical cream to her pharmacy. Recommend steroid cream twice daily.  Orders Placed This Encounter  Procedures   TSH    Standing Status:   Future    Expected Date:   10/15/2024    Expiration Date:   10/15/2025   TSH    Standing Status:   Future    Expected Date:   12/17/2024    Expiration Date:   12/17/2025     [name], MD  INTERVAL HISTORY: Patient returns for follow-up.  Recently having cold-like symptoms from her children.  She is feeling better.  Nasal drainage is better.  No shortness of breath or coughing.  Rash appears similar to previously with history of eczema.  She is using desonide once a day.  Oncology History  Clear cell renal cell carcinoma, left (HCC)  01/24/2024 Imaging   CT CAP 1. Heterogeneous mass with cystic and solid regions  and calcifications in the mid to upper left kidney measuring 9.9 x 6.8 x 8.1 cm, concerning for renal cell carcinoma. There is moderate hydronephrosis involving the lower pole of the left kidney without evidence of obstructing lesion. There is no definite evidence renal vein thrombosis or lymphadenopathy. 2. Trace right pleural effusion with atelectasis at the lung bases. No pulmonary nodule or mass is seen. 3. Aortic atherosclerosis.   01/27/2024 Initial Diagnosis   Clear cell renal cell carcinoma, left (HCC)   02/19/2024 Pathology Results   LEFT KIDNEY WITH PERIAORTIC LYMPH NODES, RESECTION:  Clear cell renal cell carcinoma, nuclear grade 4, size 9.0 cm  Tumor extends into segmental branches of renal vein, perirenal and renal sinus fat, but not beyond Gerotas fascia (pT3a)  Ureteral, vascular and all margins of resection are negative for tumor   Sarcomatoid Features: Negative  Rhabdoid Features: Negative   Regional Lymph Nodes:       No lymph nodes submitted or found: NA       Number of Lymph Nodes Involved: 0       Number of Lymph Nodes Examined: 2     03/09/2024 Cancer Staging   Staging form: Kidney, AJCC 8th Edition - Clinical: Stage III (cT3a, cN0, cM0) - Signed by Chihuahua Pauletta JAYSON, MD on 03/09/2024 Histologic grade (G): G4 Histologic grading system: 4 grade system   04/02/2024 -  Chemotherapy   Patient is on Treatment Plan : KIDNEY Pembrolizumab  (200) q21d        PHYSICAL EXAMINATION: ECOG  PERFORMANCE STATUS: 0  Vitals:   09/24/24 1533  BP: (!) 111/57  Pulse: 82  Resp: 17  Temp: 97.8 F (36.6 C)  SpO2: 98%   Filed Weights   09/24/24 1533  Weight: 138 lb (62.6 kg)    GENERAL: alert, no distress and comfortable SKIN: skin color normal and no jaundice or bruising  EYES:  sclera clear LYMPH:  no palpable cervical, axillary lymphadenopathy  LUNGS: clear to auscultation and no wheeze or rales with normal breathing effort HEART: regular rate & rhythm  ABDOMEN:  abdomen soft, non-tender and nondistended. Musculoskeletal: no edema  Relevant data reviewed during this visit included labs.

## 2024-09-24 NOTE — Assessment & Plan Note (Signed)
 Refill steroid topical cream to her pharmacy. Recommend steroid cream twice daily.

## 2024-09-24 NOTE — Assessment & Plan Note (Addendum)
 Continue levothyroxine . Increase to 75 mcg daily on 12/12 Follow-up results.

## 2024-09-26 ENCOUNTER — Other Ambulatory Visit: Payer: Self-pay

## 2024-10-14 NOTE — Assessment & Plan Note (Addendum)
 Continue levothyroxine . Increase to 75 mcg daily on 12/12. Less hard stools. Follow-up results.

## 2024-10-14 NOTE — Assessment & Plan Note (Addendum)
 Unclear if these are truly new or not. Two were not in the same plane Recommend short term follow up in about 11/08/2024

## 2024-10-14 NOTE — Assessment & Plan Note (Addendum)
 Report few subcentimeter nodules on the right. Recommend short term follow up in about 2 months Continue immunotherapy

## 2024-10-14 NOTE — Progress Notes (Unsigned)
 [facility] OFFICE PROGRESS NOTE  Patient Care Team: Perri Ronal PARAS, MD as PCP - General (Internal Medicine)  Tammy Hendrix is a 54 y.o.female with unremarkable history being follow up at [facility] for left renal cell carcinoma.   Diagnosis: pT3a pN0 cM0. 9.9 x 6.8 x 8.1 cm G4 LEFT renal clear cell RCC. Treatment: left radical nephrectomy with retroperitoneal lymph node dissection.  04/02/24 C1 adjuvant Pembro  Diarrhea transiently resolved. Rashes stable. Overall feeling well to proceed with treatment. Assessment & Plan Clear cell renal cell carcinoma, left (HCC) Report few subcentimeter nodules on the right. Recommend short term follow up in about 2 months Continue immunotherapy Acquired hypothyroidism Continue levothyroxine . Increase to 75 mcg daily on 12/12. Less hard stools. Follow-up results. Pulmonary nodules Unclear if these are truly new or not. Two were not in the same plane Recommend short term follow up in about 11/08/2024     Tammy JAYSON Chihuahua, MD  INTERVAL HISTORY: Patient returns for follow-up. Diarrhea overnight, resolved. No worsening rash. No stomach pain, fever or chills. No coughing or other new symptoms. Feeling tired.  Oncology History  Clear cell renal cell carcinoma, left (HCC)  01/24/2024 Imaging   CT CAP 1. Heterogeneous mass with cystic and solid regions and calcifications in the mid to upper left kidney measuring 9.9 x 6.8 x 8.1 cm, concerning for renal cell carcinoma. There is moderate hydronephrosis involving the lower pole of the left kidney without evidence of obstructing lesion. There is no definite evidence renal vein thrombosis or lymphadenopathy. 2. Trace right pleural effusion with atelectasis at the lung bases. No pulmonary nodule or mass is seen. 3. Aortic atherosclerosis.   01/27/2024 Initial Diagnosis   Clear cell renal cell carcinoma, left (HCC)   02/19/2024 Pathology Results   LEFT KIDNEY WITH PERIAORTIC LYMPH  NODES, RESECTION:  Clear cell renal cell carcinoma, nuclear grade 4, size 9.0 cm  Tumor extends into segmental branches of renal vein, perirenal and renal sinus fat, but not beyond Gerotas fascia (pT3a)  Ureteral, vascular and all margins of resection are negative for tumor   Sarcomatoid Features: Negative  Rhabdoid Features: Negative   Regional Lymph Nodes:       No lymph nodes submitted or found: NA       Number of Lymph Nodes Involved: 0       Number of Lymph Nodes Examined: 2     03/09/2024 Cancer Staging   Staging form: Kidney, AJCC 8th Edition - Clinical: Stage III (cT3a, cN0, cM0) - Signed by Hendrix Tammy JAYSON, MD on 03/09/2024 Histologic grade (G): G4 Histologic grading system: 4 grade system   04/02/2024 -  Chemotherapy   Patient is on Treatment Plan : KIDNEY Pembrolizumab  (200) q21d        PHYSICAL EXAMINATION: ECOG PERFORMANCE STATUS: 0  Vitals:   10/15/24 1516 10/15/24 1522  BP: (!) 113/54 (!) 103/57  Pulse: 73   Resp: 17   Temp: (!) 97.2 F (36.2 C)   SpO2: 100%    Filed Weights   10/15/24 1516  Weight: 132 lb 11.2 oz (60.2 kg)    GENERAL: alert, no distress and comfortable SKIN: skin color normal and no jaundice or bruising Few scattered rashes over arms. EYES: normal, sclera clear OROPHARYNX: no exudate  NECK: No palpable mass LYMPH:  no palpable cervical lymphadenopathy  LUNGS: clear to auscultation and no wheeze or rales with normal breathing effort HEART: regular rate & rhythm  ABDOMEN: abdomen soft, non-tender and nondistended. Musculoskeletal:  no edema NEURO: no focal motor/sensory deficits  Relevant data reviewed during this visit included labs.

## 2024-10-15 ENCOUNTER — Inpatient Hospital Stay

## 2024-10-15 VITALS — BP 103/57 | HR 73 | Temp 97.2°F | Resp 17 | Ht 62.0 in | Wt 132.7 lb

## 2024-10-15 DIAGNOSIS — R918 Other nonspecific abnormal finding of lung field: Secondary | ICD-10-CM

## 2024-10-15 DIAGNOSIS — C642 Malignant neoplasm of left kidney, except renal pelvis: Secondary | ICD-10-CM

## 2024-10-15 DIAGNOSIS — E039 Hypothyroidism, unspecified: Secondary | ICD-10-CM | POA: Diagnosis not present

## 2024-10-15 LAB — CMP (CANCER CENTER ONLY)
ALT: 12 U/L (ref 0–44)
AST: 22 U/L (ref 15–41)
Albumin: 3.4 g/dL — ABNORMAL LOW (ref 3.5–5.0)
Alkaline Phosphatase: 64 U/L (ref 38–126)
Anion gap: 12 (ref 5–15)
BUN: 14 mg/dL (ref 6–20)
CO2: 20 mmol/L — ABNORMAL LOW (ref 22–32)
Calcium: 8.3 mg/dL — ABNORMAL LOW (ref 8.9–10.3)
Chloride: 106 mmol/L (ref 98–111)
Creatinine: 1.11 mg/dL — ABNORMAL HIGH (ref 0.44–1.00)
GFR, Estimated: 59 mL/min — ABNORMAL LOW
Glucose, Bld: 85 mg/dL (ref 70–99)
Potassium: 4 mmol/L (ref 3.5–5.1)
Sodium: 137 mmol/L (ref 135–145)
Total Bilirubin: 0.5 mg/dL (ref 0.0–1.2)
Total Protein: 6.7 g/dL (ref 6.5–8.1)

## 2024-10-15 LAB — CBC WITH DIFFERENTIAL (CANCER CENTER ONLY)
Abs Immature Granulocytes: 0.01 K/uL (ref 0.00–0.07)
Basophils Absolute: 0.1 K/uL (ref 0.0–0.1)
Basophils Relative: 1 %
Eosinophils Absolute: 1 K/uL — ABNORMAL HIGH (ref 0.0–0.5)
Eosinophils Relative: 12 %
HCT: 33.4 % — ABNORMAL LOW (ref 36.0–46.0)
Hemoglobin: 11.6 g/dL — ABNORMAL LOW (ref 12.0–15.0)
Immature Granulocytes: 0 %
Lymphocytes Relative: 38 %
Lymphs Abs: 3.1 K/uL (ref 0.7–4.0)
MCH: 33.2 pg (ref 26.0–34.0)
MCHC: 34.7 g/dL (ref 30.0–36.0)
MCV: 95.7 fL (ref 80.0–100.0)
Monocytes Absolute: 0.7 K/uL (ref 0.1–1.0)
Monocytes Relative: 8 %
Neutro Abs: 3.3 K/uL (ref 1.7–7.7)
Neutrophils Relative %: 41 %
Platelet Count: 360 K/uL (ref 150–400)
RBC: 3.49 MIL/uL — ABNORMAL LOW (ref 3.87–5.11)
RDW: 12.4 % (ref 11.5–15.5)
WBC Count: 8 K/uL (ref 4.0–10.5)
nRBC: 0 % (ref 0.0–0.2)

## 2024-10-15 LAB — TSH: TSH: 0.366 u[IU]/mL (ref 0.350–4.500)

## 2024-10-15 MED ORDER — SODIUM CHLORIDE 0.9 % IV SOLN
200.0000 mg | Freq: Once | INTRAVENOUS | Status: AC
  Administered 2024-10-15: 200 mg via INTRAVENOUS
  Filled 2024-10-15: qty 200

## 2024-10-15 MED ORDER — SODIUM CHLORIDE 0.9 % IV SOLN: INTRAVENOUS | Status: DC

## 2024-10-15 NOTE — Patient Instructions (Signed)

## 2024-10-17 LAB — T4, FREE: Free T4: 1.29 ng/dL (ref 0.80–2.00)

## 2024-10-18 ENCOUNTER — Ambulatory Visit (HOSPITAL_COMMUNITY)
Admission: RE | Admit: 2024-10-18 | Discharge: 2024-10-18 | Disposition: A | Discharge status: Home / Self Care | Source: Ambulatory Visit

## 2024-10-18 DIAGNOSIS — C649 Malignant neoplasm of unspecified kidney, except renal pelvis: Secondary | ICD-10-CM

## 2024-10-18 DIAGNOSIS — C642 Malignant neoplasm of left kidney, except renal pelvis: Secondary | ICD-10-CM | POA: Diagnosis present

## 2024-10-18 MED ORDER — GADOBUTROL 1 MMOL/ML IV SOLN
6.0000 mL | Freq: Once | INTRAVENOUS | Status: AC | PRN
  Administered 2024-10-18: 6 mL via INTRAVENOUS

## 2024-10-19 ENCOUNTER — Ambulatory Visit: Payer: Self-pay

## 2024-10-19 NOTE — Telephone Encounter (Signed)
 Notified of message below

## 2024-10-19 NOTE — Telephone Encounter (Signed)
-----   Message from [name], MD sent at 10/19/2024  4:22 PM EST ----- Tammy Hendrix Would you let her know good news brain MRI was normal.  Thank you.

## 2024-10-22 ENCOUNTER — Encounter: Payer: Self-pay | Admitting: Obstetrics and Gynecology

## 2024-10-22 ENCOUNTER — Telehealth: Payer: Self-pay

## 2024-10-22 DIAGNOSIS — N390 Urinary tract infection, site not specified: Secondary | ICD-10-CM

## 2024-10-22 NOTE — Telephone Encounter (Signed)
 LVM that we did not get the UA but I will consult with Dr Tina about it. Arland Legions BSN RN

## 2024-10-25 ENCOUNTER — Inpatient Hospital Stay

## 2024-10-26 ENCOUNTER — Inpatient Hospital Stay

## 2024-10-26 ENCOUNTER — Other Ambulatory Visit: Payer: Self-pay | Admitting: Obstetrics and Gynecology

## 2024-10-26 DIAGNOSIS — R928 Other abnormal and inconclusive findings on diagnostic imaging of breast: Secondary | ICD-10-CM

## 2024-10-26 DIAGNOSIS — N390 Urinary tract infection, site not specified: Secondary | ICD-10-CM

## 2024-10-26 LAB — URINALYSIS, COMPLETE (UACMP) WITH MICROSCOPIC
Bilirubin Urine: NEGATIVE
Glucose, UA: NEGATIVE mg/dL
Ketones, ur: NEGATIVE mg/dL
Nitrite: NEGATIVE
Protein, ur: NEGATIVE mg/dL
Specific Gravity, Urine: 1.014 (ref 1.005–1.030)
pH: 5 (ref 5.0–8.0)

## 2024-10-27 LAB — URINE CULTURE: Culture: >=100000 — AB

## 2024-10-28 ENCOUNTER — Ambulatory Visit: Payer: Self-pay

## 2024-10-28 MED ORDER — AMOXICILLIN 500 MG PO CAPS: 500.0000 mg | ORAL_CAPSULE | Freq: Three times a day (TID) | ORAL | 0 refills | Status: AC

## 2024-10-28 NOTE — Telephone Encounter (Signed)
-----   Message from [name], MD sent at 10/28/2024 11:34 AM EST ----- Trenten Watchman for her UTI symptoms, please let her know and send  -Amoxicillin  500 mg three times daily for 5 days.  Increase fluid intake. Thanks

## 2024-10-28 NOTE — Telephone Encounter (Signed)
 LM with note below. Prescription sent to pharmacy

## 2024-10-29 NOTE — Progress Notes (Unsigned)
 [facility] OFFICE PROGRESS NOTE  Patient Care Team: Perri Ronal PARAS, MD as PCP - General (Internal Medicine)   Tammy Hendrix is a 54 y.o.female with unremarkable history being follow [facility] at [facility] for left renal cell carcinoma.   Diagnosis: pT3a pN0 cM0. 9.9 x 6.8 x 8.1 cm G4 LEFT renal clear cell RCC. Treatment: left radical nephrectomy with retroperitoneal lymph node dissection.  04/02/24 C1 adjuvant Pembro   Diarrhea transiently resolved. Rashes stable. Overall feeling well to proceed with treatment.  UTI  Assessment & Plan   No orders of the defined types were placed in this encounter.    [name], MD  INTERVAL HISTORY: Patient returns for follow-up.  Oncology History  Clear cell renal cell carcinoma, left (HCC)  01/24/2024 Imaging   CT CAP 1. Heterogeneous mass with cystic and solid regions and calcifications in the mid to upper left kidney measuring 9.9 x 6.8 x 8.1 cm, concerning for renal cell carcinoma. There is moderate hydronephrosis involving the lower pole of the left kidney without evidence of obstructing lesion. There is no definite evidence renal vein thrombosis or lymphadenopathy. 2. Trace right pleural effusion with atelectasis at the lung bases. No pulmonary nodule or mass is seen. 3. Aortic atherosclerosis.   01/27/2024 Initial Diagnosis   Clear cell renal cell carcinoma, left (HCC)   02/19/2024 Pathology Results   LEFT KIDNEY WITH PERIAORTIC LYMPH NODES, RESECTION:  Clear cell renal cell carcinoma, nuclear grade 4, size 9.0 cm  Tumor extends into segmental branches of renal vein, perirenal and renal sinus fat, but not beyond Gerotas fascia (pT3a)  Ureteral, vascular and all margins of resection are negative for tumor   Sarcomatoid Features: Negative  Rhabdoid Features: Negative   Regional Lymph Nodes:       No lymph nodes submitted or found: NA       Number of Lymph Nodes Involved: 0       Number of Lymph Nodes  Examined: 2     03/09/2024 Cancer Staging   Staging form: Kidney, AJCC 8th Edition - Clinical: Stage III (cT3a, cN0, cM0) - Signed by Chihuahua Pauletta JAYSON, MD on 03/09/2024 Histologic grade (G): G4 Histologic grading system: 4 grade system   04/02/2024 -  Chemotherapy   Patient is on Treatment Plan : KIDNEY Pembrolizumab  (200) q21d        PHYSICAL EXAMINATION: ECOG PERFORMANCE STATUS: {CHL ONC ECOG [id number]}  There were no vitals filed for this visit. There were no vitals filed for this visit.  GENERAL: alert, no distress and comfortable SKIN: skin color normal and no jaundice or bruising or petechiae on exposed skin EYES: normal, sclera clear OROPHARYNX: no exudate  NECK: No palpable mass LYMPH:  no palpable cervical, axillary lymphadenopathy  LUNGS: clear to auscultation and no wheeze or rales with normal breathing effort HEART: regular rate & rhythm  ABDOMEN: abdomen soft, non-tender and nondistended. Musculoskeletal: no edema NEURO: no focal motor/sensory deficits  Relevant data reviewed during this visit included labs.  New labs ordered.

## 2024-11-03 ENCOUNTER — Encounter

## 2024-11-03 ENCOUNTER — Other Ambulatory Visit

## 2024-11-05 ENCOUNTER — Inpatient Hospital Stay

## 2024-11-12 ENCOUNTER — Ambulatory Visit (HOSPITAL_BASED_OUTPATIENT_CLINIC_OR_DEPARTMENT_OTHER)

## 2024-11-26 ENCOUNTER — Inpatient Hospital Stay

## 2024-12-17 ENCOUNTER — Inpatient Hospital Stay
